# Patient Record
Sex: Male | Born: 1985
Health system: Southern US, Community
[De-identification: ages and names within clinical notes are randomized; demographics above are authoritative.]

## PROBLEM LIST (undated history)

## (undated) DIAGNOSIS — K219 Gastro-esophageal reflux disease without esophagitis: Secondary | ICD-10-CM

## (undated) DIAGNOSIS — K589 Irritable bowel syndrome without diarrhea: Secondary | ICD-10-CM

## (undated) DIAGNOSIS — T7840XA Allergy, unspecified, initial encounter: Secondary | ICD-10-CM

## (undated) DIAGNOSIS — F419 Anxiety disorder, unspecified: Secondary | ICD-10-CM

## (undated) HISTORY — PX: OTHER SURGICAL HISTORY: SHX169

## (undated) HISTORY — DX: Irritable bowel syndrome, unspecified: K58.9

## (undated) HISTORY — DX: Anxiety disorder, unspecified: F41.9

## (undated) HISTORY — DX: Allergy, unspecified, initial encounter: T78.40XA

## (undated) HISTORY — DX: Gastro-esophageal reflux disease without esophagitis: K21.9

---

## 2011-05-28 ENCOUNTER — Emergency Department: Payer: Self-pay | Admitting: Emergency Medicine

## 2012-04-05 ENCOUNTER — Emergency Department: Payer: Self-pay | Admitting: Emergency Medicine

## 2013-11-01 ENCOUNTER — Emergency Department: Payer: Self-pay | Admitting: Emergency Medicine

## 2013-11-01 LAB — URINALYSIS, COMPLETE
BILIRUBIN, UR: NEGATIVE
Bacteria: NONE SEEN
Glucose,UR: NEGATIVE mg/dL (ref 0–75)
KETONE: NEGATIVE
LEUKOCYTE ESTERASE: NEGATIVE
NITRITE: NEGATIVE
PROTEIN: NEGATIVE
Ph: 6 (ref 4.5–8.0)
RBC,UR: 1 /HPF (ref 0–5)
SPECIFIC GRAVITY: 1.015 (ref 1.003–1.030)
Squamous Epithelial: NONE SEEN

## 2013-11-01 LAB — COMPREHENSIVE METABOLIC PANEL
Albumin: 4.3 g/dL (ref 3.4–5.0)
Alkaline Phosphatase: 71 U/L
Anion Gap: 5 — ABNORMAL LOW (ref 7–16)
BILIRUBIN TOTAL: 0.6 mg/dL (ref 0.2–1.0)
BUN: 22 mg/dL — ABNORMAL HIGH (ref 7–18)
CHLORIDE: 100 mmol/L (ref 98–107)
CREATININE: 1.14 mg/dL (ref 0.60–1.30)
Calcium, Total: 9.6 mg/dL (ref 8.5–10.1)
Co2: 28 mmol/L (ref 21–32)
EGFR (African American): >60
Glucose: 102 mg/dL — ABNORMAL HIGH (ref 65–99)
Osmolality: 270 (ref 275–301)
Potassium: 3.8 mmol/L (ref 3.5–5.1)
SGOT(AST): 25 U/L (ref 15–37)
SGPT (ALT): 25 U/L (ref 12–78)
Sodium: 133 mmol/L — ABNORMAL LOW (ref 136–145)
TOTAL PROTEIN: 8.1 g/dL (ref 6.4–8.2)

## 2013-11-01 LAB — CBC WITH DIFFERENTIAL/PLATELET
Basophil #: 0.1 10*3/uL (ref 0.0–0.1)
Basophil %: 0.6 %
EOS ABS: 0 10*3/uL (ref 0.0–0.7)
Eosinophil %: 0.4 %
HCT: 50.6 % (ref 40.0–52.0)
HGB: 17.3 g/dL (ref 13.0–18.0)
Lymphocyte #: 1.3 10*3/uL (ref 1.0–3.6)
Lymphocyte %: 11.8 %
MCH: 33.2 pg (ref 26.0–34.0)
MCHC: 34.2 g/dL (ref 32.0–36.0)
MCV: 97 fL (ref 80–100)
Monocyte #: 0.7 x10 3/mm (ref 0.2–1.0)
Monocyte %: 6.2 %
NEUTROS ABS: 8.8 10*3/uL — AB (ref 1.4–6.5)
Neutrophil %: 81 %
PLATELETS: 262 10*3/uL (ref 150–440)
RBC: 5.21 10*6/uL (ref 4.40–5.90)
RDW: 13.4 % (ref 11.5–14.5)
WBC: 10.9 10*3/uL — ABNORMAL HIGH (ref 3.8–10.6)

## 2013-11-01 LAB — LIPASE, BLOOD: Lipase: 146 U/L (ref 73–393)

## 2014-10-13 ENCOUNTER — Emergency Department: Admit: 2014-10-13 | Disposition: A | Discharge status: Home / Self Care | Payer: Self-pay | Admitting: Internal Medicine

## 2014-10-13 LAB — COMPREHENSIVE METABOLIC PANEL
ALK PHOS: 57 U/L
ANION GAP: 5 — AB (ref 7–16)
Albumin: 4.7 g/dL
BILIRUBIN TOTAL: 0.9 mg/dL
BUN: 16 mg/dL
CREATININE: 1.08 mg/dL
Calcium, Total: 9.3 mg/dL
Chloride: 103 mmol/L
Co2: 29 mmol/L
EGFR (African American): >60
Glucose: 99 mg/dL
POTASSIUM: 3.9 mmol/L
SGOT(AST): 23 U/L
SGPT (ALT): 20 U/L
Sodium: 137 mmol/L
Total Protein: 7.7 g/dL

## 2014-10-13 LAB — URINALYSIS, COMPLETE
Bacteria: NONE SEEN
Bilirubin,UR: NEGATIVE
Glucose,UR: NEGATIVE mg/dL (ref 0–75)
KETONE: NEGATIVE
Leukocyte Esterase: NEGATIVE
Nitrite: NEGATIVE
PH: 7 (ref 4.5–8.0)
PROTEIN: NEGATIVE
SPECIFIC GRAVITY: 1.015 (ref 1.003–1.030)
Squamous Epithelial: NONE SEEN

## 2014-10-13 LAB — CBC
HCT: 46.3 % (ref 40.0–52.0)
HGB: 15.9 g/dL (ref 13.0–18.0)
MCH: 32.9 pg (ref 26.0–34.0)
MCHC: 34.4 g/dL (ref 32.0–36.0)
MCV: 96 fL (ref 80–100)
Platelet: 266 10*3/uL (ref 150–440)
RBC: 4.84 10*6/uL (ref 4.40–5.90)
RDW: 12.2 % (ref 11.5–14.5)
WBC: 10 10*3/uL (ref 3.8–10.6)

## 2014-10-19 ENCOUNTER — Encounter: Payer: Self-pay | Admitting: Primary Care

## 2014-10-19 ENCOUNTER — Ambulatory Visit (INDEPENDENT_AMBULATORY_CARE_PROVIDER_SITE_OTHER): Payer: BLUE CROSS/BLUE SHIELD | Admitting: Primary Care

## 2014-10-19 VITALS — BP 130/60 | HR 90 | Temp 98.0°F | Ht 68.0 in | Wt 163.4 lb

## 2014-10-19 DIAGNOSIS — R234 Changes in skin texture: Secondary | ICD-10-CM

## 2014-10-19 DIAGNOSIS — K219 Gastro-esophageal reflux disease without esophagitis: Secondary | ICD-10-CM | POA: Insufficient documentation

## 2014-10-19 NOTE — Patient Instructions (Signed)
Your xrays and blood work from [facility] is normal, including your liver counts. Continue taking the meloxicam and flexeril for the next 3 days. You will take the omeprazole once daily for reflux symptoms. Schedule a physical in the next 3-4 months. You will also schedule a lab appointment one week prior. You will be contacted regarding your referral to dermatology, please let me know if you don't hear back in one week. It was a pleasure to meet you today! Please don't hesitate to call me with any questions. Welcome to [facility]!

## 2014-10-19 NOTE — Progress Notes (Signed)
Pre visit review using our clinic review tool, if applicable. No additional management support is needed unless otherwise documented below in the visit note. 

## 2014-10-19 NOTE — Assessment & Plan Note (Signed)
Multiple moles present to posterior chest.  2 in particular appear abnormal. Referral made to dermatology for further evaluation.

## 2014-10-19 NOTE — Progress Notes (Signed)
Subjective:    Patient ID: Rodney Gardner, male    DOB: 1986/06/18, 29 y.o.   MRN: [id number]  HPI  Rodney Gardner is a 29 year old male who presents today to establish care and discuss the problems mentioned below. Rodney Gardner reports Rodney Gardner's not seen a PCP in over 10 years and doesn't believe Rodney Gardner has any records. Will review records from [facility] ED.  Rodney Gardner was seen in the [facility] Emergency Department on 10/13/14.  Rodney Gardner presented with RUQ abdominal/chest wall pain that has been present intermittently since November 2015, with worsening pain over past week. Rodney Gardner was also evaluated for low back pain. Rodney Gardner endured xrays and blood work. His xrays showed some L4/L5 disc narrowing and a mild lumbar and thoracic scoliosis. No fracture. His blood work and urinalysis was unremarkable.  Rodney Gardner was prescribed flexeril and mobic for low back pain which has improved. Rodney Gardner was also determined to have GERD and was provided with omeprazole 20mg  daily.  His RUQ pain is also much improved after 2 days of taking the medications (meloxicam and omeprazole, with occasional flexeril).  Denies recent injury/trauma.  Rodney Gardner was concerned that Rodney Gardner may have liver damage due to a history of heavy drinking and steroid.   2) GERD: Taking Omeprazole 20mg  daily and reports a decrease in symptoms. Rodney Gardner does report reflux of gastric contents if laying down after meals, after drinking carbonated beverages, and eating spicy foods. Denies nausea, vomiting, bloody stools.  3) Mole changes: Rodney Gardner's noticed a texture change in 2 moles that are present to his back. His friends told him that the moles have different colors and are shaped oddly. Denies pain, itching, bleeding.  Review of Systems  Constitutional: Negative for unexpected weight change.  HENT: Negative for rhinorrhea.   Respiratory: Negative for cough and shortness of breath.   Cardiovascular: Negative for chest pain.  Gastrointestinal: Negative for nausea, vomiting, abdominal pain, diarrhea, constipation and  blood in stool.  Genitourinary: Negative for dysuria and frequency.  Musculoskeletal:       Seen at [facility] for low back pain, now better.  Skin: Negative for rash.  Neurological: Negative for dizziness and headaches.  Psychiatric/Behavioral:       Denies concerns for anxiety or depression.       Past Medical History  Diagnosis Date  . Allergy     Seasonal allergies    History   Social History  . Marital Status: Single    Spouse Name: N/A  . Number of Children: N/A  . Years of Education: N/A   Occupational History  . Not on file.   Social History Main Topics  . Smoking status: Current Every Day Smoker -- 0.50 packs/day    Types: Cigarettes  . Smokeless tobacco: Not on file  . Alcohol Use: 0.0 oz/week    0 Standard drinks or equivalent per week     Comment: occ  . Drug Use: No  . Sexual Activity: Not on file   Other Topics Concern  . Not on file   Social History Narrative   Single.   Works on [employer] has been for 10 years.   68 son 84 years old.   Enjoys working out, being outside.   Has a place at the beach.    History reviewed. No pertinent past surgical history.  Family History  Problem Relation Age of Onset  . Cancer Mother     thyroid    Allergies  Allergen Reactions  . Wellbutrin [Bupropion] Hives  No current outpatient prescriptions on file prior to visit.   No current facility-administered medications on file prior to visit.    BP 130/60 mmHg  Pulse 90  Temp(Src) 98 F (36.7 C) (Oral)  Ht 5\' 8"  (1.727 m)  Wt 163 lb 6.4 oz (74.118 kg)  BMI 24.85 kg/m2  SpO2 98%    Objective:   Physical Exam  Constitutional: Rodney Gardner is oriented to person, place, and time. Rodney Gardner appears well-developed.  HENT:  Right Ear: External ear normal.  Left Ear: External ear normal.  Nose: Nose normal.  Mouth/Throat: Oropharynx is clear and moist.  Eyes: EOM are normal. Pupils are equal, round, and reactive to light.  Neck: Neck supple. No thyromegaly present.    Cardiovascular: Normal rate and regular rhythm.   Pulmonary/Chest: Effort normal and breath sounds normal.  Abdominal: Soft. Bowel sounds are normal. Rodney Gardner exhibits no mass. There is no tenderness.  Lymphadenopathy:    Rodney Gardner has no cervical adenopathy.  Neurological: Rodney Gardner is alert and oriented to person, place, and time. Rodney Gardner has normal reflexes.  Skin: Skin is warm and dry.     2 small misshapen and discolored moles present to posterior chest.   Psychiatric: Rodney Gardner has a normal mood and affect.          Assessment & Plan:

## 2014-10-19 NOTE — Assessment & Plan Note (Signed)
Reflux of contents if eating trigger foods and laying flat after meals. Taking omeprazole 20mg  daily with a decrease in symptoms. Will continue to monitor.

## 2014-11-15 ENCOUNTER — Other Ambulatory Visit: Payer: Self-pay | Admitting: Primary Care

## 2015-02-10 ENCOUNTER — Other Ambulatory Visit: Payer: Self-pay | Admitting: Primary Care

## 2015-02-10 DIAGNOSIS — Z0000 Encounter for general adult medical examination without abnormal findings: Secondary | ICD-10-CM

## 2015-02-20 ENCOUNTER — Other Ambulatory Visit: Payer: BLUE CROSS/BLUE SHIELD

## 2015-02-23 ENCOUNTER — Encounter: Payer: BLUE CROSS/BLUE SHIELD | Admitting: Primary Care

## 2015-05-27 ENCOUNTER — Other Ambulatory Visit: Payer: Self-pay | Admitting: Primary Care

## 2015-05-29 NOTE — Telephone Encounter (Signed)
Electronically refill request for   omeprazole (PRILOSEC) 20 MG capsule   TAKE ONE CAPSULE BY MOUTH EVERY DAY  Dispense: 30 capsule   Refills: 5     Last prescribed on 11/15/2014. Last seen on 10/19/2014. No future appointment.

## 2015-12-11 ENCOUNTER — Other Ambulatory Visit: Payer: Self-pay | Admitting: Primary Care

## 2016-04-05 ENCOUNTER — Ambulatory Visit (INDEPENDENT_AMBULATORY_CARE_PROVIDER_SITE_OTHER): Payer: BLUE CROSS/BLUE SHIELD | Admitting: Primary Care

## 2016-04-05 ENCOUNTER — Encounter: Payer: Self-pay | Admitting: Primary Care

## 2016-04-05 VITALS — BP 130/84 | HR 82 | Temp 97.9°F | Ht 68.0 in | Wt 171.0 lb

## 2016-04-05 DIAGNOSIS — K589 Irritable bowel syndrome without diarrhea: Secondary | ICD-10-CM | POA: Diagnosis not present

## 2016-04-05 DIAGNOSIS — K219 Gastro-esophageal reflux disease without esophagitis: Secondary | ICD-10-CM

## 2016-04-05 DIAGNOSIS — F411 Generalized anxiety disorder: Secondary | ICD-10-CM

## 2016-04-05 MED ORDER — DICYCLOMINE HCL 10 MG PO CAPS: 10.0000 mg | ORAL_CAPSULE | Freq: Three times a day (TID) | ORAL | 0 refills | Status: DC | PRN

## 2016-04-05 MED ORDER — ESCITALOPRAM OXALATE 10 MG PO TABS
10.0000 mg | ORAL_TABLET | Freq: Every day | ORAL | 1 refills | Status: DC
Start: 2016-04-05 — End: 2016-05-17

## 2016-04-05 NOTE — Patient Instructions (Signed)
Start Lexapro tablets for anxiety. Take 1/2 tablet by mouth everyday for 8 days, then advance to 1 full tablet thereafter.  You may take dicyclomine 10 mg tablets three times daily as needed for abdominal pain/spasms.  Continue Zantac 150 mg tablets for acid reflux. You may take this up to twice daily.  Nasal Polyp: Try using Flonase (fluticasone) nasal spray. Instill 1 spray in each nostril twice daily. Do this for an entire month.  Schedule a follow up visit in 6 weeks for re-evaluation.  It was a pleasure to see you today!  Food Choices for Gastroesophageal Reflux Disease, Adult When you have gastroesophageal reflux disease (GERD), the foods you eat and your eating habits are very important. Choosing the right foods can help ease the discomfort of GERD. WHAT GENERAL GUIDELINES DO I NEED TO FOLLOW?  Choose fruits, vegetables, whole grains, low-fat dairy products, and low-fat meat, fish, and poultry.  Limit fats such as oils, salad dressings, butter, nuts, and avocado.  Keep a food diary to identify foods that cause symptoms.  Avoid foods that cause reflux. These may be different for different people.  Eat frequent small meals instead of three large meals each day.  Eat your meals slowly, in a relaxed setting.  Limit fried foods.  Cook foods using methods other than frying.  Avoid drinking alcohol.  Avoid drinking large amounts of liquids with your meals.  Avoid bending over or lying down until 2-3 hours after eating. WHAT FOODS ARE NOT RECOMMENDED? The following are some foods and drinks that may worsen your symptoms: Vegetables Tomatoes. Tomato juice. Tomato and spaghetti sauce. Chili peppers. Onion and garlic. Horseradish. Fruits Oranges, grapefruit, and lemon (fruit and juice). Meats High-fat meats, fish, and poultry. This includes hot dogs, ribs, ham, sausage, salami, and bacon. Dairy Whole milk and chocolate milk. Sour cream. Cream. Butter. Ice cream. Cream  cheese.  Beverages Coffee and tea, with or without caffeine. Carbonated beverages or energy drinks. Condiments Hot sauce. Barbecue sauce.  Sweets/Desserts Chocolate and cocoa. Donuts. Peppermint and spearmint. Fats and Oils High-fat foods, including [place] fries and potato chips. Other Vinegar. Strong spices, such as black pepper, white pepper, red pepper, cayenne, curry powder, cloves, ginger, and chili powder. The items listed above may not be a complete list of foods and beverages to avoid. Contact your dietitian for more information.   This information is not intended to replace advice given to you by your health care provider. Make sure you discuss any questions you have with your health care provider.   Document Released: 06/17/2005 Document Revised: 07/08/2014 Document Reviewed: 04/21/2013 Elsevier Interactive Patient Education [employer].

## 2016-04-05 NOTE — Progress Notes (Signed)
Pre visit review using our clinic review tool, if applicable. No additional management support is needed unless otherwise documented below in the visit note. 

## 2016-04-05 NOTE — Progress Notes (Signed)
Subjective:    Patient ID: Rodney Gardner, male    DOB: [birth year], 30 y.o.   MRN: [id number]  HPI  Rodney Gardner is a 30 year old male with a history of GERD who presents today with a chief complaint of abdominal pain. His pain is located to the bilateral lower quadrants with radiation to his right lower back and RUQ. He also reports bloating/feeling gassy. His pain has been consistent for the past 4 years, some days his pain is worse. He describes his pain as "burning, sharp, tearing sensation". His abdominal pain is worse with increased stress, and his symptoms have increased in the past 3 months as he's undertaking more work with his job. He also reports multiple episodes of diarrhea daily for the past 3-4 years. Denies nausea, vomiting, changes in appetite, unexplained weight loss. He underwent a CT of his abdomen/pelivis in 2015 which was unremarkable. GAD 7 score of 16 today.  Over the years he's tried to self treat his pain by changing his diet, refraining from alcohol use, and stopped drinking alcohol without improvement, now he's resumed drinking (drinks on Friday and Saturday nights). He avoids fried foods, sodas, spicy foods.  He has a long history of esophageal reflux with symptoms including esophageal burning, difficulty swallowing, cough and choking. He takes Zantac 150 mg once daily and has done so for the past 1-2 months with improvement to his esophageal reflux, cough, and choking. He was previously managed on omeprazole 20 mg but did not notice improvement. He then increased his dose to 40 mg without improvement.   He smokes cigarettes and has done so for the past 7-8 years. He's been under a lot of stress for the past 3-4 years.   Review of Systems  Constitutional: Negative for chills, fatigue and fever.  Gastrointestinal: Positive for abdominal pain and diarrhea. Negative for blood in stool, constipation, nausea and vomiting.  Genitourinary: Negative for dysuria, flank pain,  frequency and hematuria.  Musculoskeletal: Positive for back pain.  Psychiatric/Behavioral: Negative for suicidal ideas. The patient is nervous/anxious.        Past Medical History:  Diagnosis Date  . Allergy    Seasonal allergies     Social History   Social History  . Marital status: Single    Spouse name: N/A  . Number of children: N/A  . Years of education: N/A   Occupational History  . Not on file.   Social History Main Topics  . Smoking status: Current Some Day Smoker    Packs/day: 0.50    Types: Cigarettes  . Smokeless tobacco: Never Used  . Alcohol use 0.0 oz/week     Comment: occ  . Drug use: No  . Sexual activity: Not on file   Other Topics Concern  . Not on file   Social History Narrative   Single.   Works on [employer] has been for 10 years.   47 son 57 years old.   Enjoys working out, being outside.   Has a place at the beach.    No past surgical history on file.  Family History  Problem Relation Age of Onset  . Cancer Mother     thyroid    Allergies  Allergen Reactions  . Wellbutrin [Bupropion] Hives    No current outpatient prescriptions on file prior to visit.   No current facility-administered medications on file prior to visit.     BP 130/84   Pulse 82   Temp 97.9 F (36.6 C) (Oral)  Ht 5\' 8"  (1.727 m)   Wt 171 lb (77.6 kg)   SpO2 98%   BMI 26.00 kg/m    Objective:   Physical Exam  Constitutional: He appears well-nourished.  Cardiovascular: Normal rate and regular rhythm.   Pulmonary/Chest: Effort normal and breath sounds normal.  Abdominal: Soft. Normal appearance and bowel sounds are normal. There is generalized tenderness. There is no rebound, no tenderness at McBurney's point and negative Murphy's sign.  Skin: Skin is warm and dry.  Psychiatric: He has a normal mood and affect.          Assessment & Plan:

## 2016-04-05 NOTE — Assessment & Plan Note (Signed)
Improvement on Zantac 150 mg. Discussed that he may take this twice daily. Will continue to monitor.

## 2016-04-05 NOTE — Assessment & Plan Note (Signed)
Symptoms today and over the pat 3-4 years sound like IBS given constant stress with presents of symptoms with diarrhea. Do not suspect acute appendicitis, gall bladder involvement, diverticulitis.  Normal CT in 2015. Will treat stress/anxiety with Lexapro, treat abdominal cramping/pain with Dicyclomine. Follow up in 6 weeks.

## 2016-04-05 NOTE — Assessment & Plan Note (Signed)
GAD 7 score of 16 today. Likely contributing to his abdominal symptoms. Long discussion today regarding treatment options.  Start Lexapro 10 mg. Patient is to take 1/2 tablet daily for 8 days, then advance to 1 full tablet thereafter. We discussed possible side effects of headache, GI upset, drowsiness, and SI/HI. If thoughts of SI/HI develop, we discussed to present to the emergency immediately. Patient verbalized understanding.   Follow up in 6 weeks.

## 2016-05-07 ENCOUNTER — Other Ambulatory Visit: Payer: Self-pay | Admitting: Primary Care

## 2016-05-07 ENCOUNTER — Encounter: Payer: Self-pay | Admitting: Primary Care

## 2016-05-07 DIAGNOSIS — K589 Irritable bowel syndrome without diarrhea: Secondary | ICD-10-CM

## 2016-05-07 MED ORDER — DICYCLOMINE HCL 10 MG PO CAPS
10.0000 mg | ORAL_CAPSULE | Freq: Three times a day (TID) | ORAL | 0 refills | Status: DC | PRN
Start: 2016-05-07 — End: 2016-08-12

## 2016-05-12 ENCOUNTER — Encounter: Payer: Self-pay | Admitting: Primary Care

## 2016-05-17 ENCOUNTER — Ambulatory Visit (INDEPENDENT_AMBULATORY_CARE_PROVIDER_SITE_OTHER): Payer: BLUE CROSS/BLUE SHIELD | Admitting: Primary Care

## 2016-05-17 ENCOUNTER — Encounter: Payer: Self-pay | Admitting: Primary Care

## 2016-05-17 VITALS — BP 114/70 | HR 60 | Temp 98.3°F | Ht 68.0 in | Wt 167.1 lb

## 2016-05-17 DIAGNOSIS — B9689 Other specified bacterial agents as the cause of diseases classified elsewhere: Secondary | ICD-10-CM

## 2016-05-17 DIAGNOSIS — F411 Generalized anxiety disorder: Secondary | ICD-10-CM

## 2016-05-17 DIAGNOSIS — J019 Acute sinusitis, unspecified: Secondary | ICD-10-CM

## 2016-05-17 DIAGNOSIS — K219 Gastro-esophageal reflux disease without esophagitis: Secondary | ICD-10-CM

## 2016-05-17 DIAGNOSIS — K589 Irritable bowel syndrome without diarrhea: Secondary | ICD-10-CM

## 2016-05-17 MED ORDER — AMOXICILLIN-POT CLAVULANATE 875-125 MG PO TABS: 1.0000 | ORAL_TABLET | Freq: Two times a day (BID) | ORAL | 0 refills | Status: DC

## 2016-05-17 MED ORDER — ESCITALOPRAM OXALATE 20 MG PO TABS: 20.0000 mg | ORAL_TABLET | Freq: Every day | ORAL | 1 refills | Status: DC

## 2016-05-17 MED ORDER — PANTOPRAZOLE SODIUM 20 MG PO TBEC: 20.0000 mg | DELAYED_RELEASE_TABLET | Freq: Every day | ORAL | 0 refills | Status: DC

## 2016-05-17 NOTE — Progress Notes (Signed)
Subjective:    Patient ID: Rodney Gardner, male    DOB: [birth year], 30 y.o.   MRN: [id number]  HPI  Mr. Debardelaben is a 30 year old male who presents today for follow up of generalized anxiety disorder. He presented in early October 2017 with complaints of IBS symptoms that have been present over the past 3-4 years. He also struggles with anxiety which was suspected to be contributing to abdominal symptoms. He was placed on Lexapro 10 mg for control of anxiety.  Since his last visit he's not noticed any improvement in anxiety or abdominal symptoms. He continues to notice esophageal reflux nearly daily and has been taking Zantac 150 mg BID. He was previously managed on omeprazole without improvement. He actually notices more improvement on Zantac than he did with omeprazole. He continues to experience right lower quadrant abdominal pain which he describes as "uncomfortable". He did take the dicyclomine once daily with improvement, but has not yet picked up his refill.  He continues to feel stressed as he is overwhelmed with work. He hasn't noticed any improvement since initiation of Lexapro 10 mg.    Review of Systems  Constitutional: Negative for fever.  Respiratory: Negative for shortness of breath.   Cardiovascular: Negative for chest pain.  Gastrointestinal: Positive for abdominal pain.       Esophageal reflux symptoms  Psychiatric/Behavioral: Negative for suicidal ideas. The patient is nervous/anxious.        Past Medical History:  Diagnosis Date  . Allergy    Seasonal allergies     Social History   Social History  . Marital status: Single    Spouse name: N/A  . Number of children: N/A  . Years of education: N/A   Occupational History  . Not on file.   Social History Main Topics  . Smoking status: Current Some Day Smoker    Packs/day: 0.50    Types: Cigarettes  . Smokeless tobacco: Never Used  . Alcohol use 0.0 oz/week     Comment: occ  . Drug use: No  . Sexual activity:  Not on file   Other Topics Concern  . Not on file   Social History Narrative   Single.   Works on [employer] has been for 10 years.   107 son 31 years old.   Enjoys working out, being outside.   Has a place at the beach.    No past surgical history on file.  Family History  Problem Relation Age of Onset  . Cancer Mother     thyroid    Allergies  Allergen Reactions  . Wellbutrin [Bupropion] Hives    Current Outpatient Prescriptions on File Prior to Visit  Medication Sig Dispense Refill  . dicyclomine (BENTYL) 10 MG capsule Take 1 capsule (10 mg total) by mouth 3 (three) times daily as needed for spasms. 30 capsule 0  . ranitidine (ZANTAC) 150 MG tablet Take 150 mg by mouth daily.     No current facility-administered medications on file prior to visit.     BP 114/70   Pulse 60   Temp 98.3 F (36.8 C) (Oral)   Ht 5\' 8"  (1.727 m)   Wt 167 lb 1.9 oz (75.8 kg)   SpO2 98%   BMI 25.41 kg/m    Objective:   Physical Exam  Constitutional: He appears well-nourished.  Neck: Neck supple.  Cardiovascular: Normal rate and regular rhythm.   Pulmonary/Chest: Effort normal and breath sounds normal.  Abdominal: Soft. Bowel sounds are normal.  There is no tenderness.  Skin: Skin is warm and dry.  Psychiatric: He has a normal mood and affect.          Assessment & Plan:

## 2016-05-17 NOTE — Patient Instructions (Signed)
We've increased your dose of Lexapro from 10 mg to 20 mg. You may take two of the 10 mg tablets until your current bottle is empty. Take 1 of the 20 mg tablets once daily.  Start pantoprazole (Protonix) 20 mg tablets for acid reflux. Take 1 tablet by mouth once daily. Stop using the Zantac for now.  Start Augmentin antibiotics. Take 1 tablet by mouth twice daily for 10 days.   Continue using Flonase (fluticasone) nasal spray. Instill 1 spray in each nostril twice daily.   Start taking a daily antihistamine such as Claritin, Zyrtec, Allegra.  Follow up in 6 weeks for re-evaluation.  It was a pleasure to see you today!

## 2016-05-17 NOTE — Progress Notes (Signed)
Pre visit review using our clinic review tool, if applicable. No additional management support is needed unless otherwise documented below in the visit note. 

## 2016-05-18 NOTE — Assessment & Plan Note (Signed)
Improvement with dicyclomine.  Continues to experience symptoms which are most likely secondary to uncontrolled anxiety and stress. Long discussion today regarding importance of symptom control. Will continue to treat anxiety. No alarm signs for IBS or abdominal pain.

## 2016-05-18 NOTE — Assessment & Plan Note (Signed)
Continues to experience daily symptoms with Zantac BID, but overall improvement than with omeprazole in the past. Will trial short term pantoprazole 20 mg.  If no improvement will increase dose. Discussed triggers of GERD. Continue to suspect anxiety as a major trigger.

## 2016-05-18 NOTE — Assessment & Plan Note (Signed)
No improvement on Lexapro 10 mg. Continued work stress, discussed the importance of stress reduction. Increase Lexapro to 20 mg. Follow up in 6 weeks. If no improvement consider adding Buspar.

## 2016-05-29 IMAGING — CR DG CHEST 2V
1 series · 2 of 2 positions shown · non-contrast
Comparison: None.

CLINICAL DATA: Chronic right-sided chest pain

EXAM:
CHEST  2 VIEW

[Series 1: w chest pa · 0.14mm/px · 2 of 2 slices shown]
[im 1/2]
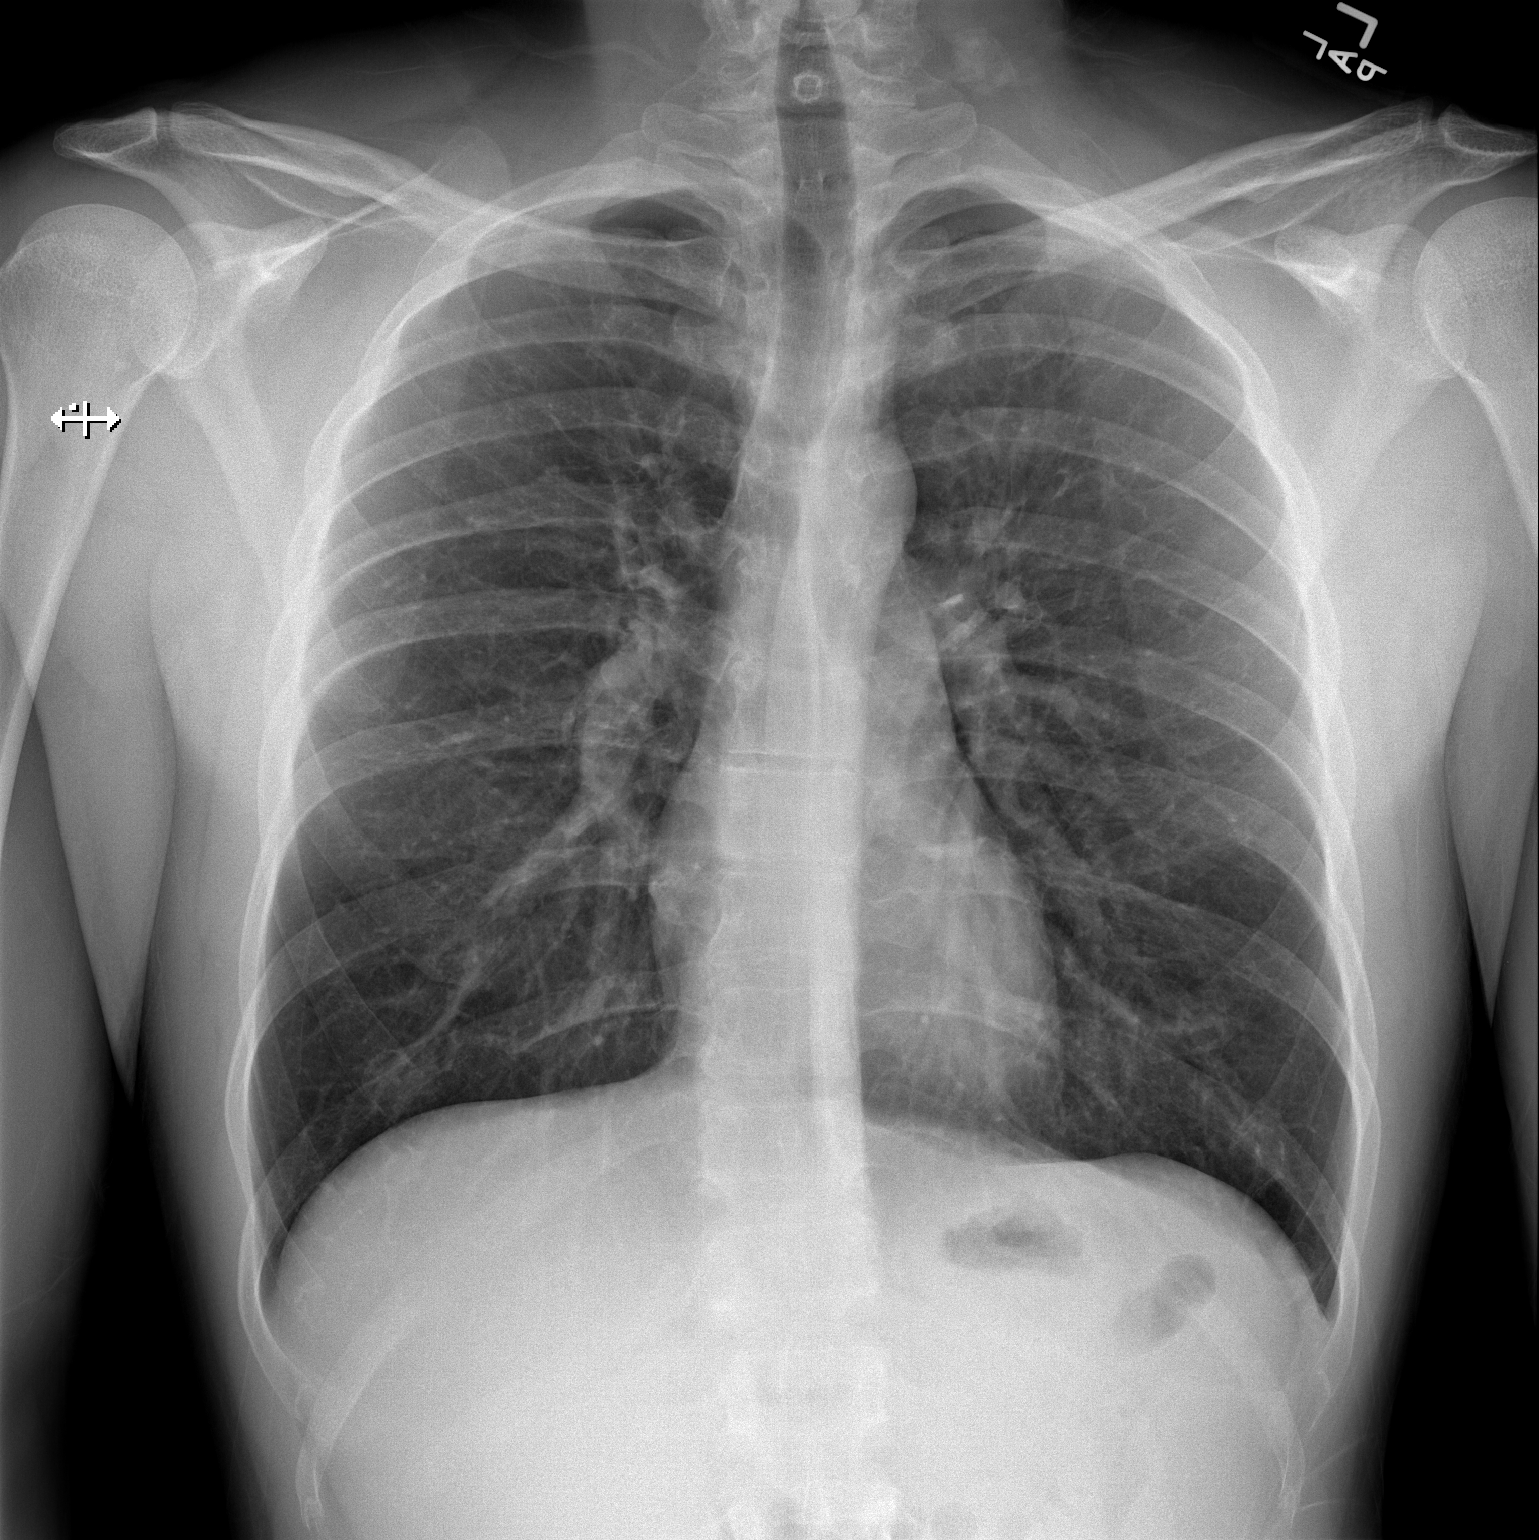
[im 2/2]
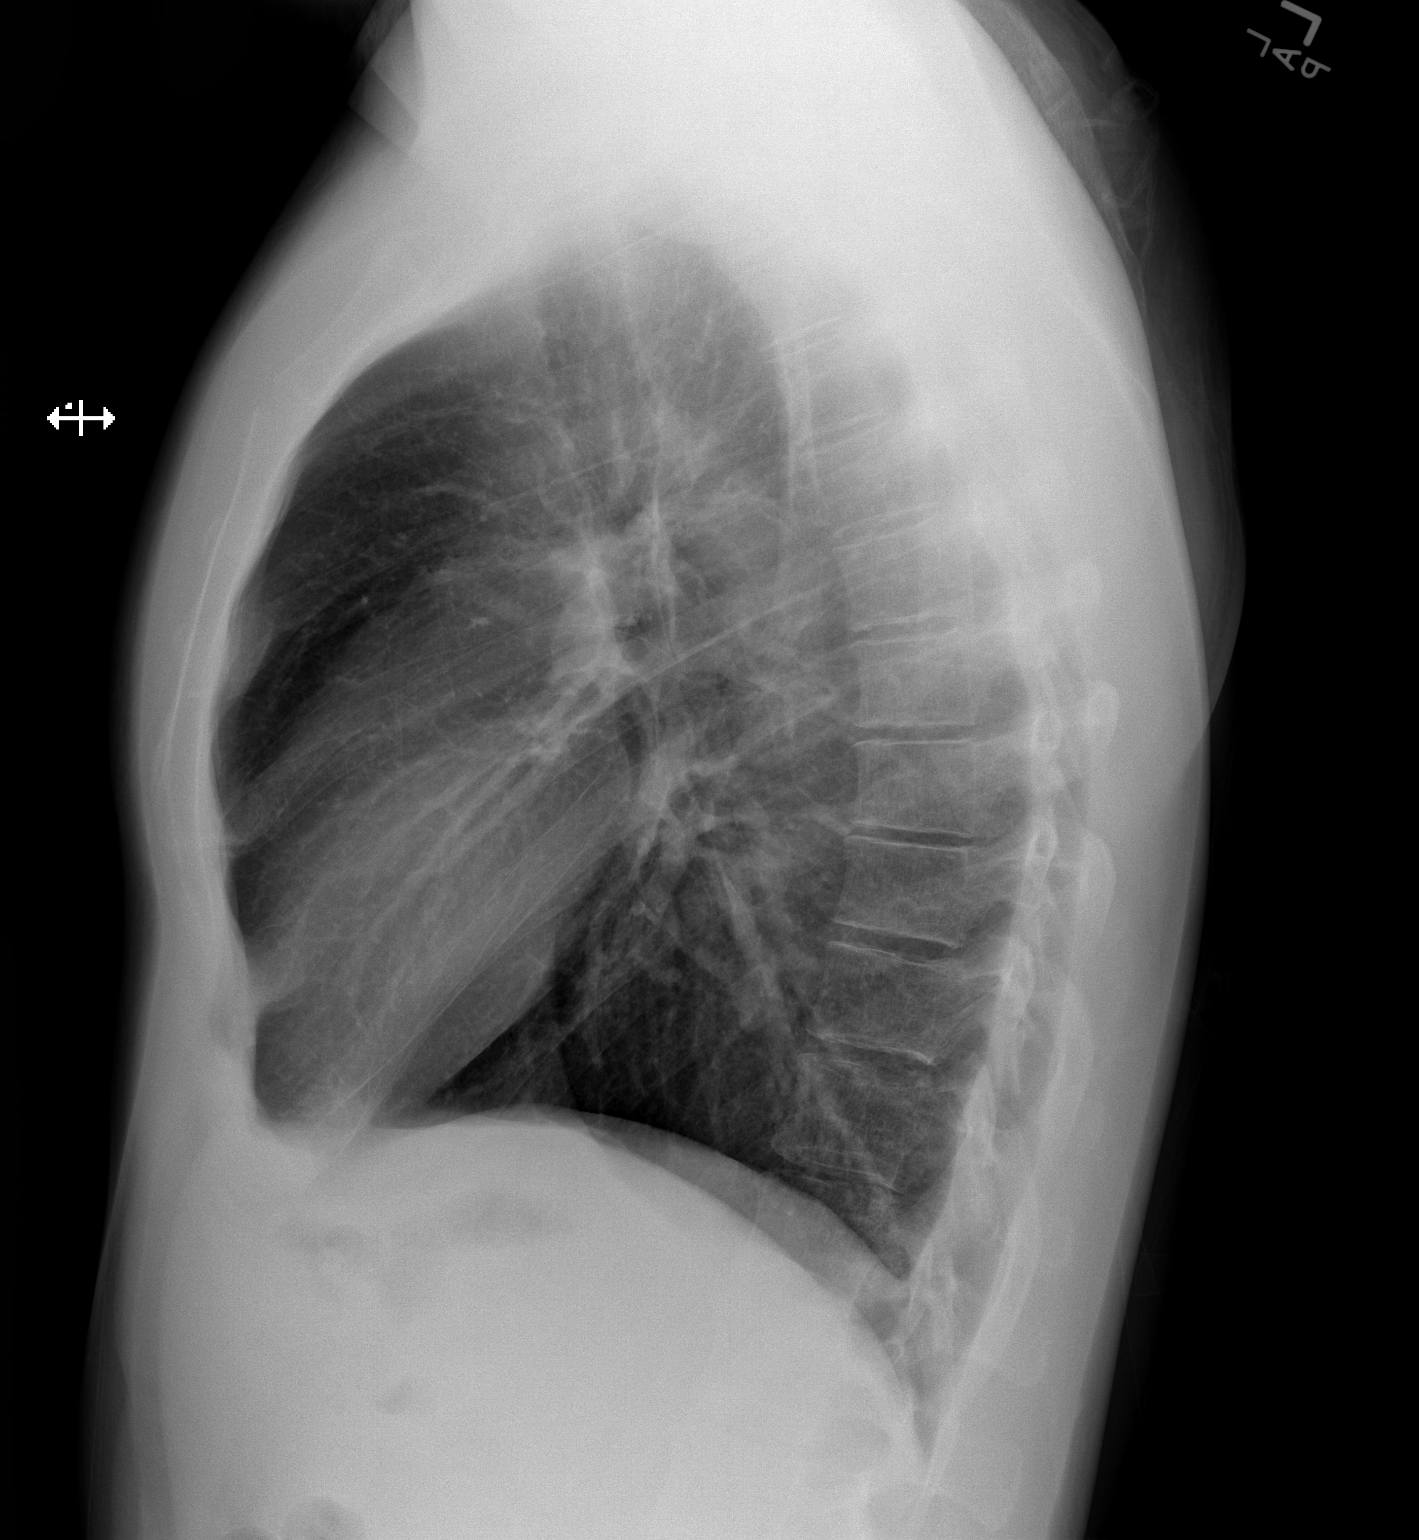

[2 of 2 positions shown; findings below may reference images not displayed]

FINDINGS: Lungs are clear. Heart size and pulmonary vascularity are normal. No
adenopathy. No pneumothorax. There is mild upper thoracic
levoscoliosis.
IMPRESSION: No edema or consolidation.

## 2016-05-29 IMAGING — CR DG LUMBAR SPINE 2-3V
1 series · 3 of 3 positions shown · non-contrast
Comparison: CT, 11/01/2013

CLINICAL DATA: Intermittent low back pain for 1 week. No specific
injury.

EXAM:
LUMBAR SPINE - 2-3 VIEW

[Series 1: w lumbar spine lat · 0.14mm/px · 3 of 3 slices shown]
[im 1/3]
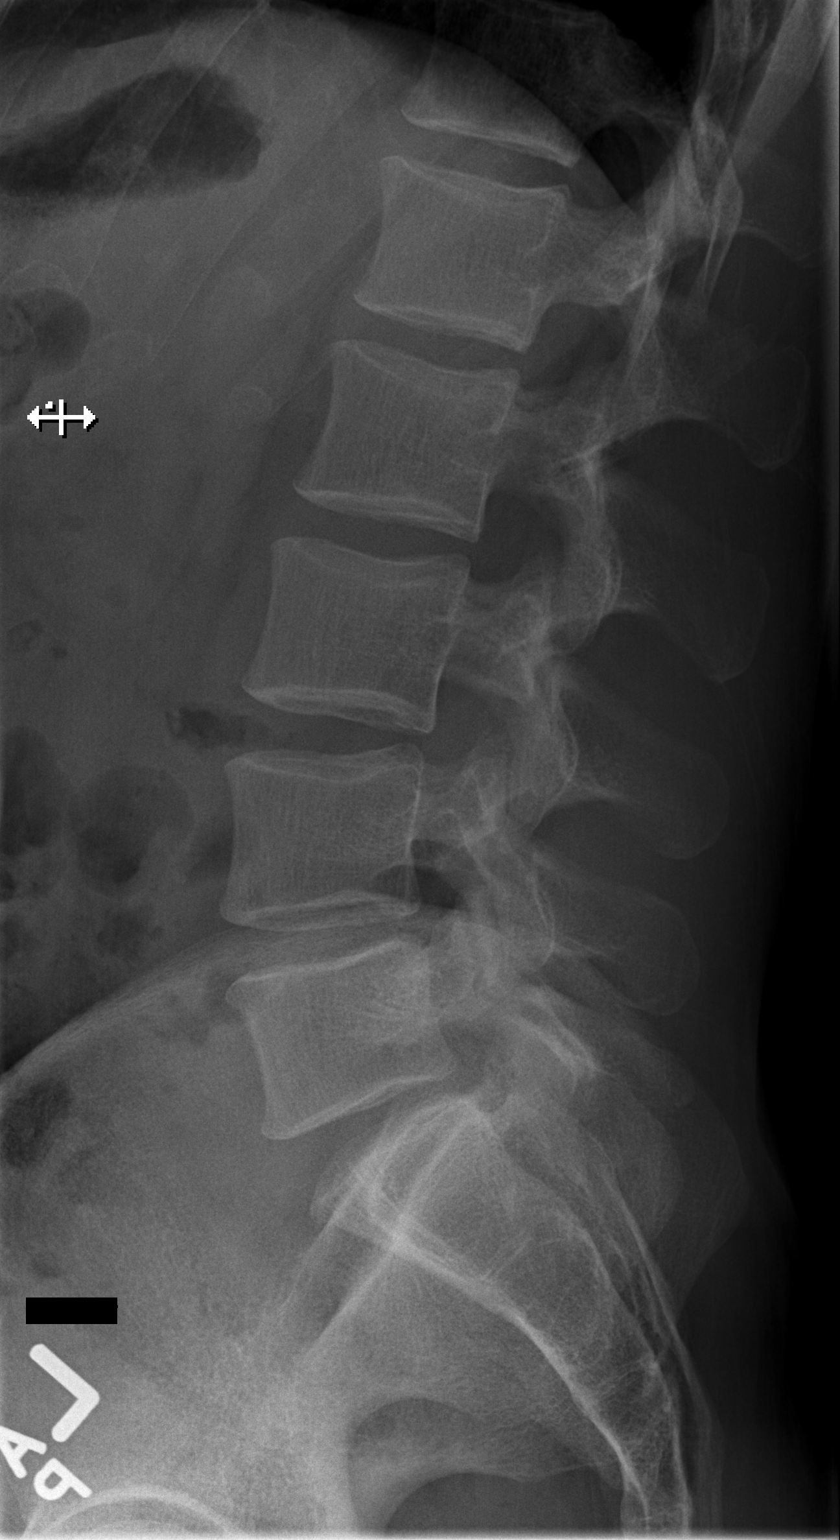
[im 2/3]
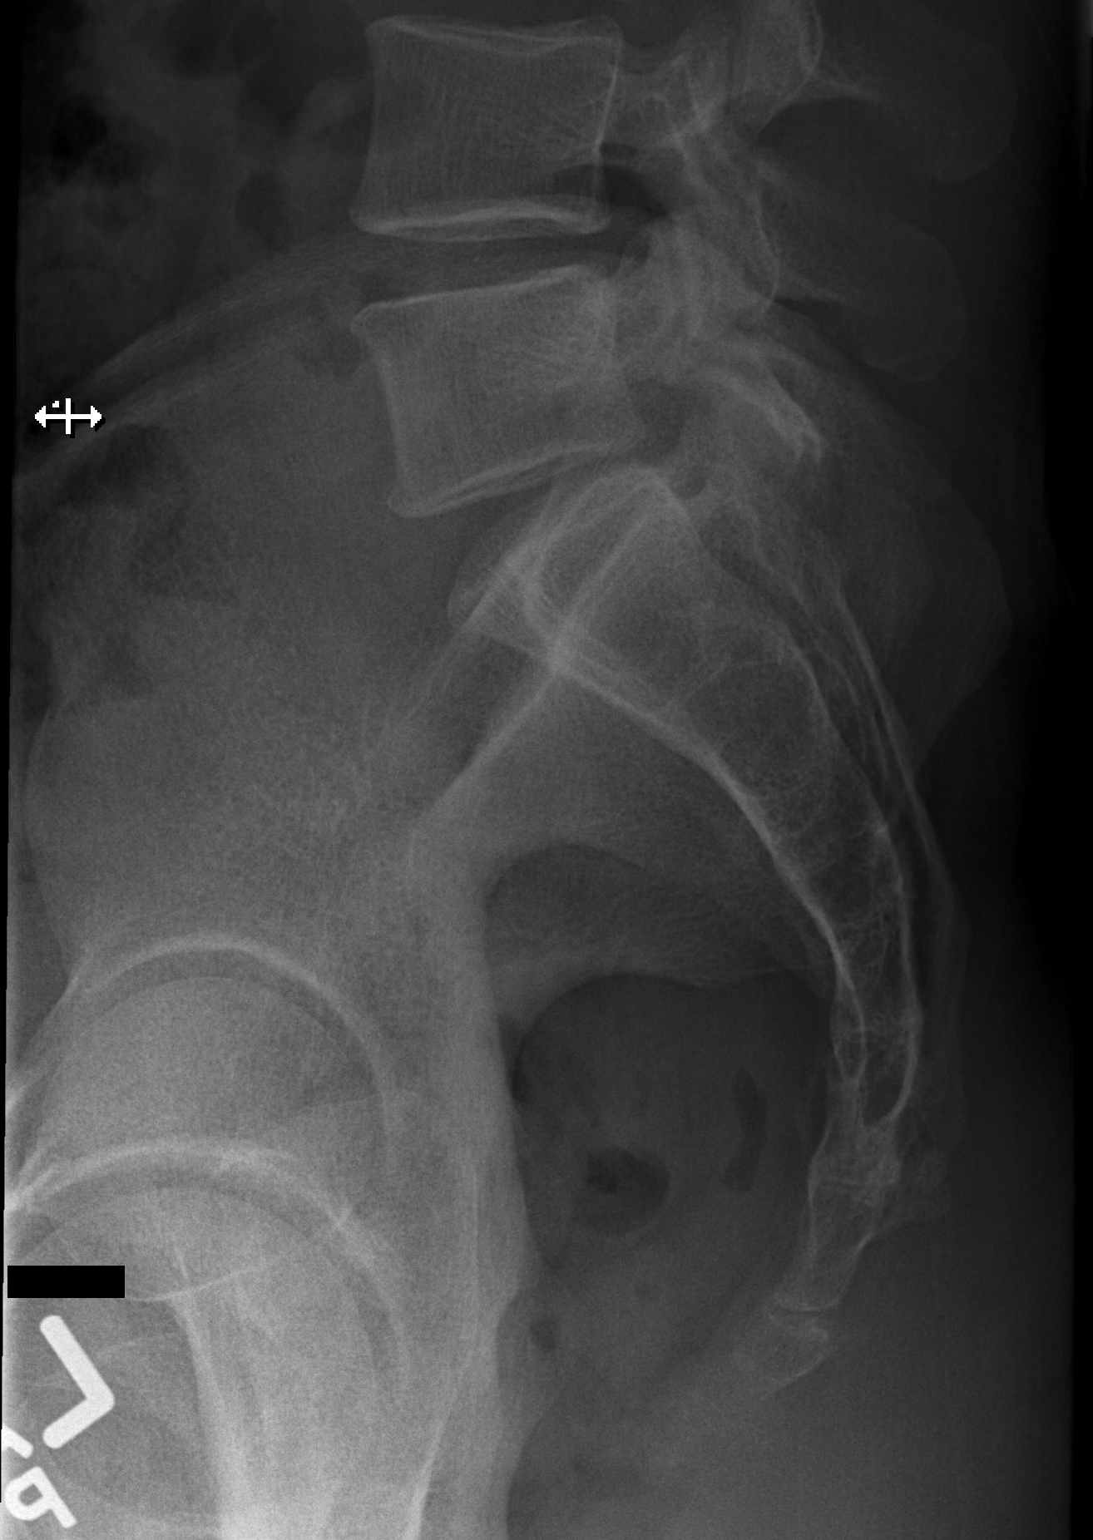
[im 3/3]
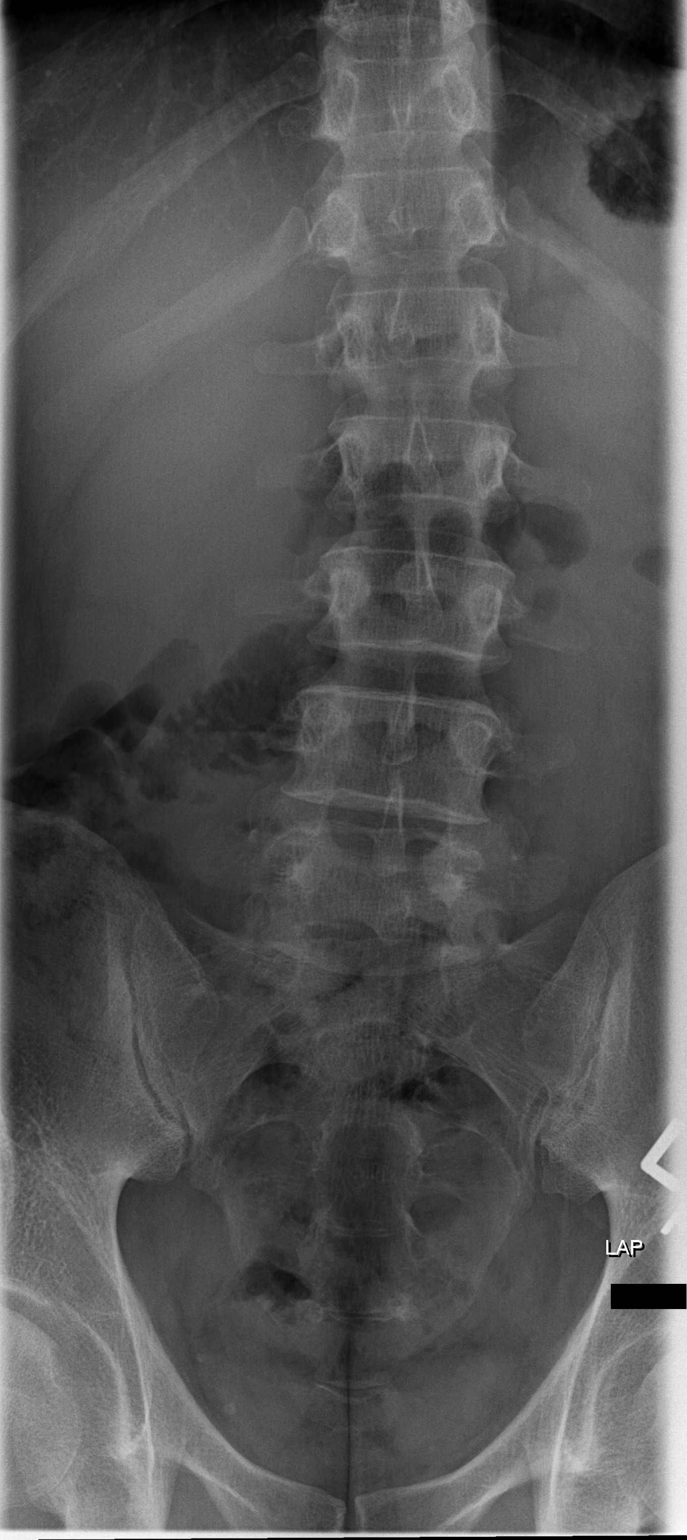

[3 of 3 positions shown; findings below may reference images not displayed]

FINDINGS: No fracture. No spondylolisthesis. There is mild loss of disc height
at L4-L5. Remaining lumbar disc spaces are well preserved.

Mild levoscoliosis with the apex at L2-L3.

Soft tissues are unremarkable.
IMPRESSION: 1. No fracture or acute finding.
2. Mild loss of disc height at L4-L5 and mild levoscoliosis.

## 2016-06-28 ENCOUNTER — Encounter: Payer: Self-pay | Admitting: Primary Care

## 2016-07-05 ENCOUNTER — Encounter: Payer: Self-pay | Admitting: Gastroenterology

## 2016-07-05 ENCOUNTER — Other Ambulatory Visit: Payer: Self-pay | Admitting: Primary Care

## 2016-07-05 ENCOUNTER — Ambulatory Visit: Payer: BLUE CROSS/BLUE SHIELD | Admitting: Primary Care

## 2016-07-05 DIAGNOSIS — R1084 Generalized abdominal pain: Secondary | ICD-10-CM

## 2016-07-31 ENCOUNTER — Other Ambulatory Visit: Payer: Self-pay | Admitting: Primary Care

## 2016-07-31 DIAGNOSIS — F411 Generalized anxiety disorder: Secondary | ICD-10-CM

## 2016-07-31 NOTE — Telephone Encounter (Signed)
Received faxed refill request for escitalopram (LEXAPRO) 20 MG tablet and is requesting for 90 days supply  Last prescribed and seen on 05/17/2016

## 2016-07-31 NOTE — Telephone Encounter (Signed)
The last message I received from him was that the Lexapro wasn't helping. Is this the case?

## 2016-08-01 ENCOUNTER — Other Ambulatory Visit: Payer: Self-pay | Admitting: Primary Care

## 2016-08-01 DIAGNOSIS — F411 Generalized anxiety disorder: Secondary | ICD-10-CM

## 2016-08-02 NOTE — Telephone Encounter (Addendum)
Spoken to patient. He stated that it is true that Lexapro was not helping but at least it is something. Patient stated if possible for Anda Kraft to prescribed something else then. He is almost out of the medication Lexapro.

## 2016-08-04 NOTE — Telephone Encounter (Signed)
Please read the message you sent. Is the Lexapro helping or not helping with anxiety? I'll send more if it's helping.

## 2016-08-05 ENCOUNTER — Encounter: Payer: Self-pay | Admitting: Primary Care

## 2016-08-05 NOTE — Telephone Encounter (Signed)
Sorry about that. Correction made comment below.

## 2016-08-06 ENCOUNTER — Other Ambulatory Visit: Payer: Self-pay | Admitting: Primary Care

## 2016-08-06 DIAGNOSIS — K219 Gastro-esophageal reflux disease without esophagitis: Secondary | ICD-10-CM

## 2016-08-06 DIAGNOSIS — F411 Generalized anxiety disorder: Secondary | ICD-10-CM

## 2016-08-06 MED ORDER — ESCITALOPRAM OXALATE 20 MG PO TABS: 20.0000 mg | ORAL_TABLET | Freq: Every day | ORAL | 1 refills | Status: DC

## 2016-08-06 MED ORDER — PANTOPRAZOLE SODIUM 20 MG PO TBEC: 20.0000 mg | DELAYED_RELEASE_TABLET | Freq: Every day | ORAL | 0 refills | Status: DC

## 2016-08-12 ENCOUNTER — Telehealth: Payer: Self-pay | Admitting: Primary Care

## 2016-08-12 ENCOUNTER — Ambulatory Visit (INDEPENDENT_AMBULATORY_CARE_PROVIDER_SITE_OTHER): Payer: BLUE CROSS/BLUE SHIELD | Admitting: Gastroenterology

## 2016-08-12 ENCOUNTER — Encounter: Payer: Self-pay | Admitting: Gastroenterology

## 2016-08-12 VITALS — BP 102/76 | HR 68 | Ht 68.0 in | Wt 179.1 lb

## 2016-08-12 DIAGNOSIS — F411 Generalized anxiety disorder: Secondary | ICD-10-CM

## 2016-08-12 DIAGNOSIS — R1084 Generalized abdominal pain: Secondary | ICD-10-CM | POA: Diagnosis not present

## 2016-08-12 DIAGNOSIS — R12 Heartburn: Secondary | ICD-10-CM | POA: Diagnosis not present

## 2016-08-12 DIAGNOSIS — R197 Diarrhea, unspecified: Secondary | ICD-10-CM

## 2016-08-12 MED ORDER — NA SULFATE-K SULFATE-MG SULF 17.5-3.13-1.6 GM/177ML PO SOLN: 1.0000 | Freq: Once | ORAL | 0 refills | Status: AC

## 2016-08-12 NOTE — Patient Instructions (Signed)
If you are age 32 or older, your body mass index should be between 23-30. Your Body mass index is 27.24 kg/m. If this is out of the aforementioned range listed, please consider follow up with your Primary Care Provider.  If you are age 89 or younger, your body mass index should be between 19-25. Your Body mass index is 27.24 kg/m. If this is out of the aformentioned range listed, please consider follow up with your Primary Care Provider.   You have been scheduled for an endoscopy and colonoscopy. Please follow the written instructions given to you at your visit today. Please pick up your prep supplies at the pharmacy within the next 1-3 days. If you use inhalers (even only as needed), please bring them with you on the day of your procedure. Your physician has requested that you go to www.startemmi.com and enter the access code given to you at your visit today. This web site gives a general overview about your procedure. However, you should still follow specific instructions given to you by our office regarding your preparation for the procedure.  Thank you for choosing [facility]  Dr Wilfrid Lund III

## 2016-08-12 NOTE — Progress Notes (Addendum)
[facility] Consult Note:  History: Rodney Gardner 08/12/2016  Referring physician: Sheral Flow, NP  Reason for consult/chief complaint: Abdominal Pain (generalized abd pain, RUQ, LLQ and RLQ); Dysphagia (problems choking on food); and Diarrhea (intermittent)   Subjective  HPI:  This is a 31 year old man referred by primary care for generalized abdominal pain and other symptoms. He seems to had at least 5 years of this constellation of symptoms. He has generalized abdominal pain that will sometimes be focused in one place and then migrate elsewhere. It is there constantly and not clearly changed with position or time of day eating or bowel movements. It seems to be worse if there is pressure on it such as a seatbelt. He reports having had perhaps 3 ED visits and [place], these seem to have been in 20 06/19/2012 and 2015, but the actual reports cannot be viewed. A CT scan abdomen and pelvis from 2015 was normal. He also has an underlying anxiety disorder for which he was started on 20 g of Lexapro in October. He does not yet feel that it has been very helpful. He was also given dicyclomine and that has not helped any GI symptoms.  In addition, he has frequent pyrosis not improved on once daily PPI, sometimes feels that food gets stuck in his chest. He has intermittent nonbloody diarrhea as well.  ROS:  Review of Systems  Constitutional: Negative for appetite change and unexpected weight change.  HENT: Negative for mouth sores and voice change.   Eyes: Negative for pain and redness.  Respiratory: Negative for cough and shortness of breath.   Cardiovascular: Negative for chest pain and palpitations.  Genitourinary: Negative for dysuria and hematuria.  Musculoskeletal: Negative for arthralgias and myalgias.  Skin: Negative for pallor and rash.  Neurological: Negative for weakness and headaches.  Hematological: Negative for adenopathy.  Psychiatric/Behavioral:  The patient is nervous/anxious.      Past Medical History: Past Medical History:  Diagnosis Date  . Allergy    Seasonal allergies  . Anxiety   . GERD (gastroesophageal reflux disease)   . Irritable bowel syndrome (IBS)      Past Surgical History: History reviewed. No pertinent surgical history.   Family History: Family History  Problem Relation Age of Onset  . Thyroid cancer Mother   . Anuerysm Paternal Grandfather     brain    Social History: Social History   Social History  . Marital status: Single    Spouse name: N/A  . Number of children: 1  . Years of education: N/A   Social History Main Topics  . Smoking status: Current Some Day Smoker    Packs/day: 0.50    Types: Cigarettes  . Smokeless tobacco: Never Used  . Alcohol use 0.0 oz/week     Comment: occ  . Drug use: No  . Sexual activity: Not Asked   Other Topics Concern  . None   Social History Narrative   Single.   Works on [employer] has been for 10 years.   33 son 77 years old.   Enjoys working out, being outside.   [place] a place at the beach.   His partner is with him today. She reports that he often does not seem to have a good appetite, but there is been no weight loss.  Ottie says he typically does not drink alcohol much during the work week, but he might have a fifth of liquor on the weekends.  Allergies: Allergies  Allergen Reactions  . Wellbutrin [  Bupropion] Hives    Outpatient Meds: Current Outpatient Prescriptions  Medication Sig Dispense Refill  . escitalopram (LEXAPRO) 20 MG tablet Take 1 tablet (20 mg total) by mouth daily. 90 tablet 1  . pantoprazole (PROTONIX) 20 MG tablet Take 1 tablet (20 mg total) by mouth daily. 90 tablet 0  . Na Sulfate-K Sulfate-Mg Sulf 17.5-3.13-1.6 GM/180ML SOLN Take 1 kit by mouth once. 354 mL 0   No current facility-administered medications for this visit.       ___________________________________________________________________ Objective    Exam:  BP 102/76 (BP Location: Left Arm, Patient Position: Sitting, Cuff Size: Normal)   Pulse 68   Ht _0  (1.727 m) Comment: height measured without shoes  Wt 179 lb 2 oz (81.3 kg)   BMI 27.24 kg/m     General: this is a(n) pleasant, somewhat anxious-appearing man without muscle wasting  Eyes: sclera anicteric, no redness  ENT: oral mucosa moist without lesions, no cervical or supraclavicular lymphadenopathy, good dentition  CV: RRR without murmur, S1/S2, no JVD, no peripheral edema  Resp: clear to auscultation bilaterally, normal RR and effort noted  GI: soft. Mild scattered tenderness, with active bowel sounds. No guarding or palpable organomegaly noted.  Skin; warm and dry, no rash or jaundice noted  Neuro: awake, alert and oriented x 3. Normal gross motor function and fluent speech  Labs: CBC Latest Ref Rng & Units 10/13/2014 11/01/2013  WBC 3.8 - 10.6 x10 3/mm 3 10.0 10.9(H)  Hemoglobin 13.0 - 18.0 g/dL 15.9 17.3  Hematocrit 40.0 - 52.0 % 46.3 50.6  Platelets 150 - 440 x10 3/mm 3 266 262   CMP Latest Ref Rng & Units 10/13/2014 11/01/2013  Glucose mg/dL 99 102(H)  BUN mg/dL 16 22(H)  Creatinine mg/dL 1.08 1.14  Sodium mmol/L 137 133(L)  Potassium mmol/L 3.9 3.8  Chloride mmol/L 103 100  CO2 mmol/L 29 28  Calcium mg/dL 9.3 9.6  Total Protein g/dL 7.7 8.1  Total Bilirubin mg/dL 0.9 0.6  Alkaline Phos U/L 57 71  AST U/L 23 25  ALT U/L 20 25    Radiologic Studies:  2015 CT abdomen and pelvis reported as noted above  Assessment: Encounter Diagnoses  Name Primary?  . Generalized abdominal pain Yes  . Diarrhea, unspecified type   . Heartburn   . GAD (generalized anxiety disorder)   This all seems to most likely at up to a functional bowel disorder. The nature of the pain, the fact that is constant, the other associated symptoms all seem to point toward this. Some underlying inflammatory process seems less likely, neoplastic given less likely given the  duration of symptoms. I feel he needs an endoscopic workup for more definitive diagnosis. Even if it is normal, which I told him it may very well be,  I believe there is value in that so he can move on with other treatments for his underlying anxiety disorder. He is in agreement Plan:  EGD/colonoscopy  The benefits and risks of the planned procedure were described in detail with the patient or (when appropriate) their health care proxy.  Risks were outlined as including, but not limited to, bleeding, infection, perforation, adverse medication reaction leading to cardiac or pulmonary decompensation, or pancreatitis (if ERCP).  The limitation of incomplete mucosal visualization was also discussed.  No guarantees or warranties were given.  Decrease alcohol use  Thank you for the courtesy of this consult.  Please call me with any questions or concerns.  Nelida Meuse III  CC:  Sheral Flow, NP -

## 2016-08-12 NOTE — Telephone Encounter (Signed)
Please notify patient that the GI doctor contacted me and updated me on his visit. I would like to see Rodney Gardner in the office to discuss his anxiety medication as he never followed up as originally scheduled. I want to provide him with the best treatment possible, so we need to re-evaluate his Lexapro face-to-face.

## 2016-08-12 NOTE — Telephone Encounter (Signed)
-----   Message from [place], MD sent at 08/12/2016  4:54 PM EST ----- Hi  Saw this man today, suspect he has chronic functional abdominal pain related to anxiety disorder.  Going to do EGD and colonoscopy to rule out organic cause.  Just so you know, he reports that the Lexapro does not seem to be helping him very much, so maybe you want to think about an adjustment/change.  Thanks.  Wilfrid [facility]

## 2016-08-13 NOTE — Telephone Encounter (Signed)
Noted  

## 2016-08-13 NOTE — Telephone Encounter (Signed)
Spoken and notified patient of Kate's comments. Patient stated that he is not sure he can afford to come in due to his insurance deductible. Patient will send Anda Kraft a message through [facility].

## 2016-08-19 ENCOUNTER — Encounter: Payer: Self-pay | Admitting: Primary Care

## 2016-08-20 ENCOUNTER — Other Ambulatory Visit: Payer: Self-pay | Admitting: Primary Care

## 2016-08-20 DIAGNOSIS — F411 Generalized anxiety disorder: Secondary | ICD-10-CM

## 2016-08-20 MED ORDER — SERTRALINE HCL 50 MG PO TABS: 50.0000 mg | ORAL_TABLET | Freq: Every day | ORAL | 1 refills | Status: DC

## 2016-08-20 NOTE — Telephone Encounter (Signed)
Spoke with patient on phone today regarding concerns. He has been taking Amoxil (old prescription) over the last several days for URI symptoms and has noticed much improvement in his abdominal symptoms. Discussed he should call the GI office and notify. Also noted that his Lexapro is not working to reduce anxiety, GI agreed. Will switch to Zoloft 50 mg and follow up with him in 4 weeks. He verbalized understanding of the plan. He will stop Lexapro.

## 2016-08-28 ENCOUNTER — Encounter: Payer: Self-pay | Admitting: Gastroenterology

## 2016-09-10 ENCOUNTER — Ambulatory Visit (AMBULATORY_SURGERY_CENTER): Payer: 59 | Admitting: Gastroenterology

## 2016-09-10 ENCOUNTER — Encounter: Payer: Self-pay | Admitting: Gastroenterology

## 2016-09-10 VITALS — BP 112/67 | HR 82 | Temp 98.7°F | Resp 21 | Ht 68.0 in | Wt 179.0 lb

## 2016-09-10 DIAGNOSIS — R197 Diarrhea, unspecified: Secondary | ICD-10-CM

## 2016-09-10 DIAGNOSIS — R1084 Generalized abdominal pain: Secondary | ICD-10-CM | POA: Diagnosis present

## 2016-09-10 DIAGNOSIS — D125 Benign neoplasm of sigmoid colon: Secondary | ICD-10-CM | POA: Diagnosis not present

## 2016-09-10 DIAGNOSIS — D127 Benign neoplasm of rectosigmoid junction: Secondary | ICD-10-CM

## 2016-09-10 MED ORDER — SODIUM CHLORIDE 0.9 % IV SOLN: 500.0000 mL | INTRAVENOUS | Status: AC

## 2016-09-10 NOTE — Progress Notes (Signed)
Patient awakening,vss,report to rn 

## 2016-09-10 NOTE — Progress Notes (Signed)
Called to room to assist during endoscopic procedure.  Patient ID and intended procedure confirmed with present staff. Received instructions for my participation in the procedure from the performing physician.  

## 2016-09-10 NOTE — Progress Notes (Signed)
Pt's states no medical or surgical changes since previsit or office visit. 

## 2016-09-10 NOTE — Patient Instructions (Signed)
YOU HAD AN ENDOSCOPIC PROCEDURE TODAY AT THE [facility]:   Refer to the procedure report that was given to you for any specific questions about what was found during the examination.  If the procedure report does not answer your questions, please call your gastroenterologist to clarify.  If you requested that your care partner not be given the details of your procedure findings, then the procedure report has been included in a sealed envelope for you to review at your convenience later.  YOU SHOULD EXPECT: Some feelings of bloating in the abdomen. Passage of more gas than usual.  Walking can help get rid of the air that was put into your GI tract during the procedure and reduce the bloating. If you had a lower endoscopy (such as a colonoscopy or flexible sigmoidoscopy) you may notice spotting of blood in your stool or on the toilet paper. If you underwent a bowel prep for your procedure, you may not have a normal bowel movement for a few days.  Please Note:  You might notice some irritation and congestion in your nose or some drainage.  This is from the oxygen used during your procedure.  There is no need for concern and it should clear up in a day or so.  SYMPTOMS TO REPORT IMMEDIATELY:   Following lower endoscopy (colonoscopy or flexible sigmoidoscopy):  Excessive amounts of blood in the stool  Significant tenderness or worsening of abdominal pains  Swelling of the abdomen that is new, acute  Fever of 100F or higher   Following upper endoscopy (EGD)  Vomiting of blood or coffee ground material  New chest pain or pain under the shoulder blades  Painful or persistently difficult swallowing  New shortness of breath  Fever of 100F or higher  Black, tarry-looking stools  For urgent or emergent issues, a gastroenterologist can be reached at any hour by calling [phone].  Please read all handouts given to you by your recovery nurse.  DIET:  We do recommend a small meal at  first, but then you may proceed to your regular diet.  Drink plenty of fluids but you should avoid alcoholic beverages for 24 hours.  ACTIVITY:  You should plan to take it easy for the rest of today and you should NOT DRIVE or use heavy machinery until tomorrow (because of the sedation medicines used during the test).    FOLLOW UP: Our staff will call the number listed on your records the next business day following your procedure to check on you and address any questions or concerns that you may have regarding the information given to you following your procedure. If we do not reach you, we will leave a message.  However, if you are feeling well and you are not experiencing any problems, there is no need to return our call.  We will assume that you have returned to your regular daily activities without incident.  If any biopsies were taken you will be contacted by phone or by letter within the next 1-3 weeks.  Please call us at [phone] if you have not heard about the biopsies in 3 weeks.    SIGNATURES/CONFIDENTIALITY: You and/or your care partner have signed paperwork which will be entered into your electronic medical record.  These signatures attest to the fact that that the information above on your After Visit Summary has been reviewed and is understood.  Full responsibility of the confidentiality of this discharge information lies with you and/or your care-partner. Thank   you for letting us take care of your healthcare needs today. 

## 2016-09-10 NOTE — Op Note (Signed)
[facility] Patient Name: Rodney Gardner Procedure Date: 09/10/2016 2:36 PM MRN: [id number] Endoscopist: Mallie Mussel L. Loletha Carrow , MD Age: 31 Referring MD:  Date of Birth: 1986/02/12 Gender: Male Account #: [id number] Procedure:                Colonoscopy Indications:              Generalized abdominal pain Medicines:                Monitored Anesthesia Care Procedure:                Pre-Anesthesia Assessment:                           - Prior to the procedure, a History and Physical                            was performed, and patient medications and                            allergies were reviewed. The patient's tolerance of                            previous anesthesia was also reviewed. The risks                            and benefits of the procedure and the sedation                            options and risks were discussed with the patient.                            All questions were answered, and informed consent                            was obtained. Prior Anticoagulants: The patient has                            taken no previous anticoagulant or antiplatelet                            agents. ASA Grade Assessment: II - A patient with                            mild systemic disease. After reviewing the risks                            and benefits, the patient was deemed in                            satisfactory condition to undergo the procedure.                           After obtaining informed consent, the colonoscope  was passed under direct vision. Throughout the                            procedure, the patient's blood pressure, pulse, and                            oxygen saturations were monitored continuously. The                            Colonoscope was introduced through the anus and                            advanced to the the terminal ileum. The colonoscopy                            was performed without difficulty.  The patient                            tolerated the procedure well. The quality of the                            bowel preparation was good. The terminal ileum,                            ileocecal valve, appendiceal orifice, and rectum                            were photographed. Scope In: 3:00:16 PM Scope Out: 3:10:51 PM Scope Withdrawal Time: 0 hours 8 minutes 11 seconds  Total Procedure Duration: 0 hours 10 minutes 35 seconds  Findings:                 The perianal and digital rectal examinations were                            normal.                           A 4 mm polyp was found in the recto-sigmoid colon.                            The polyp was semi-sessile. The polyp was removed                            with a cold snare. Resection and retrieval were                            complete.                           The exam was otherwise without abnormality on                            direct and retroflexion views. Complications:            No immediate  complications. Estimated Blood Loss:     Estimated blood loss: none. Impression:               - One 4 mm polyp at the recto-sigmoid colon,                            removed with a cold snare. Resected and retrieved.                           - The examination was otherwise normal on direct                            and retroflexion views.                           Patient reports years of symptoms and has had                            unrevealing lab and radiologic testing. No cause                            for symptoms was discovered on either EGD or                            colonoscopy today. His underlying anxiety disorder                            is believed to be a major contributing factor. Recommendation:           - Patient has a contact number available for                            emergencies. The signs and symptoms of potential                            delayed complications were discussed with the                             patient. Return to normal activities tomorrow.                            Written discharge instructions were provided to the                            patient.                           - Resume previous diet.                           - Continue present medications.                           - Await pathology results.                           -  Repeat colonoscopy is recommended for                            surveillance. The colonoscopy date will be                            determined after pathology results from today's                            exam become available for review.                           - Return to primary care physician. Kaida Games L. Loletha Carrow, MD 09/10/2016 3:20:52 PM This report has been signed electronically.

## 2016-09-10 NOTE — Op Note (Signed)
[facility] Patient Name: Rodney Gardner Procedure Date: 09/10/2016 2:40 PM MRN: [id number] Endoscopist: Mallie Mussel L. Loletha Carrow , MD Age: 31 Referring MD:  Date of Birth: [birth year] Gender: Male Account #: [id number] Procedure:                Upper GI endoscopy Indications:              Generalized abdominal pain Medicines:                Monitored Anesthesia Care Procedure:                Pre-Anesthesia Assessment:                           - Prior to the procedure, a History and Physical                            was performed, and patient medications and                            allergies were reviewed. The patient's tolerance of                            previous anesthesia was also reviewed. The risks                            and benefits of the procedure and the sedation                            options and risks were discussed with the patient.                            All questions were answered, and informed consent                            was obtained. Prior Anticoagulants: The patient has                            taken no previous anticoagulant or antiplatelet                            agents. ASA Grade Assessment: II - A patient with                            mild systemic disease. After reviewing the risks                            and benefits, the patient was deemed in                            satisfactory condition to undergo the procedure.                           After obtaining informed consent, the endoscope was  passed under direct vision. Throughout the                            procedure, the patient's blood pressure, pulse, and                            oxygen saturations were monitored continuously. The                            Endoscope was introduced through the mouth, and                            advanced to the second part of duodenum. The upper                            GI endoscopy was accomplished  without difficulty.                            The patient tolerated the procedure well. Scope In: Scope Out: Findings:                 The esophagus was normal.                           The stomach was normal.                           The cardia and gastric fundus were normal on                            retroflexion.                           The examined duodenum was normal. Complications:            No immediate complications. Estimated Blood Loss:     Estimated blood loss: none. Impression:               - Normal esophagus.                           - Normal stomach.                           - Normal examined duodenum.                           - No specimens collected. Recommendation:           - Patient has a contact number available for                            emergencies. The signs and symptoms of potential                            delayed complications were discussed with the  patient. Return to normal activities tomorrow.                            Written discharge instructions were provided to the                            patient.                           - Resume previous diet.                           - Continue present medications.                           - See the other procedure note for documentation of                            additional recommendations. Rasheen Schewe L. Loletha Carrow, MD 09/10/2016 3:15:29 PM This report has been signed electronically.

## 2016-09-11 ENCOUNTER — Telehealth: Payer: Self-pay

## 2016-09-11 NOTE — Telephone Encounter (Signed)
  Follow up Call-  Call back number 09/10/2016  Post procedure Call Back phone  # [phone]  Permission to leave phone message No  Some recent data might be hidden     Patient questions:  Do you have a fever, pain , or abdominal swelling? No. Pain Score  0 *  Have you tolerated food without any problems? Yes.    Have you been able to return to your normal activities? Yes.    Do you have any questions about your discharge instructions: Diet   No. Medications  No. Follow up visit  No.  Do you have questions or concerns about your Care? No.  Actions: * If pain score is 4 or above: No action needed, pain <4.  Pt reported a "sore throat" this am.  I referred to his EGD report which was normal and no bx were taken.  I advised pt to try a warm water gargle or OTC throat lozengers.  If sx persist to call us back.  Pt asked if Dr. Loletha Carrow saw GERD.  I read the findings to the pt from the report which was no no mention of GERD. maw

## 2016-09-13 ENCOUNTER — Encounter: Payer: Self-pay | Admitting: Gastroenterology

## 2016-09-17 ENCOUNTER — Telehealth: Payer: Self-pay | Admitting: Primary Care

## 2016-09-17 DIAGNOSIS — F411 Generalized anxiety disorder: Secondary | ICD-10-CM

## 2016-09-17 NOTE — Telephone Encounter (Signed)
-----   Message from [name], NP sent at 08/20/2016  1:19 PM EST ----- Regarding: GAD How's he doing since we switched to Zoloft from Lexapro?

## 2016-09-18 NOTE — Telephone Encounter (Signed)
Per Anda Kraft, she stated that we can increase the Zoloft but it would be better for patient to come in.

## 2016-09-18 NOTE — Telephone Encounter (Signed)
I think we need to regroup in the office. Please have him schedule an appointment with me at his convenience.

## 2016-09-18 NOTE — Telephone Encounter (Addendum)
Message left for patient to return my call of Kate's comment below.

## 2016-09-18 NOTE — Telephone Encounter (Signed)
Spoken to patient and he stated that he is not noticing any difference at all. He said he is not sure what he should say but he feels about the same.

## 2016-09-24 NOTE — Telephone Encounter (Addendum)
Sent patient a message through [facility].  Message left for patient to return my call of Kate's comments below.

## 2016-10-02 MED ORDER — SERTRALINE HCL 100 MG PO TABS: 100.0000 mg | ORAL_TABLET | Freq: Every day | ORAL | 0 refills | Status: DC

## 2016-10-02 NOTE — Telephone Encounter (Signed)
Noted, will increase dose and call patient in 4 weeks for an update.

## 2016-10-02 NOTE — Telephone Encounter (Signed)
Was able to get a hold of patient. Patient stated that he is agreeable to the increased of Zoloft.  I also notified patient to let us know if the increase dosage did not give him any improvement. He will need to come to the office to be seen and re-eval

## 2016-10-28 ENCOUNTER — Telehealth: Payer: Self-pay | Admitting: Primary Care

## 2016-10-28 DIAGNOSIS — F411 Generalized anxiety disorder: Secondary | ICD-10-CM

## 2016-10-28 NOTE — Telephone Encounter (Signed)
-----   Message from [name], NP sent at 10/02/2016  7:46 PM EDT ----- Regarding: GAD Please check on patient since we increased his dose of Zoloft from 50 mg to 100 mg.

## 2016-10-29 MED ORDER — SERTRALINE HCL 100 MG PO TABS: 100.0000 mg | ORAL_TABLET | Freq: Every day | ORAL | 0 refills | Status: DC

## 2016-10-29 NOTE — Telephone Encounter (Signed)
Noted  

## 2016-10-29 NOTE — Telephone Encounter (Signed)
Spoken to patient and he stated that he is doing okay with increased dosage. Patient stated that he need a refill but I have here that we send on 10/02/2016. Then he stated that he was given 50 mg again but he has been taking 2 of the 50 mg. I went ahead and send a refill of the 100 mg and notified him if any problems let us know.

## 2016-11-05 ENCOUNTER — Other Ambulatory Visit: Payer: Self-pay | Admitting: Primary Care

## 2016-11-05 DIAGNOSIS — F411 Generalized anxiety disorder: Secondary | ICD-10-CM

## 2016-11-05 NOTE — Telephone Encounter (Signed)
Received refill request electronically See phone note 10/28/16 Dose change

## 2016-12-09 ENCOUNTER — Emergency Department
Admission: EM | Admit: 2016-12-09 | Discharge: 2016-12-09 | Disposition: A | Discharge status: Home / Self Care | Payer: 59 | Attending: Emergency Medicine | Admitting: Emergency Medicine

## 2016-12-09 ENCOUNTER — Encounter: Payer: Self-pay | Admitting: Emergency Medicine

## 2016-12-09 ENCOUNTER — Emergency Department: Payer: 59

## 2016-12-09 ENCOUNTER — Encounter: Payer: Self-pay | Admitting: Primary Care

## 2016-12-09 DIAGNOSIS — Z79899 Other long term (current) drug therapy: Secondary | ICD-10-CM | POA: Diagnosis not present

## 2016-12-09 DIAGNOSIS — M549 Dorsalgia, unspecified: Secondary | ICD-10-CM | POA: Diagnosis not present

## 2016-12-09 DIAGNOSIS — R1011 Right upper quadrant pain: Secondary | ICD-10-CM | POA: Diagnosis present

## 2016-12-09 DIAGNOSIS — G8929 Other chronic pain: Secondary | ICD-10-CM | POA: Diagnosis not present

## 2016-12-09 DIAGNOSIS — R109 Unspecified abdominal pain: Secondary | ICD-10-CM

## 2016-12-09 DIAGNOSIS — F17210 Nicotine dependence, cigarettes, uncomplicated: Secondary | ICD-10-CM | POA: Diagnosis not present

## 2016-12-09 DIAGNOSIS — R52 Pain, unspecified: Secondary | ICD-10-CM

## 2016-12-09 LAB — URINALYSIS, COMPLETE (UACMP) WITH MICROSCOPIC
BACTERIA UA: NONE SEEN
BILIRUBIN URINE: NEGATIVE
GLUCOSE, UA: NEGATIVE mg/dL
KETONES UR: 5 mg/dL — AB
Leukocytes, UA: NEGATIVE
Nitrite: NEGATIVE
PH: 5 (ref 5.0–8.0)
PROTEIN: 30 mg/dL — AB
Specific Gravity, Urine: 1.03 (ref 1.005–1.030)

## 2016-12-09 LAB — COMPREHENSIVE METABOLIC PANEL
ALBUMIN: 4.7 g/dL (ref 3.5–5.0)
ALT: 21 U/L (ref 17–63)
AST: 27 U/L (ref 15–41)
Alkaline Phosphatase: 70 U/L (ref 38–126)
Anion gap: 10 (ref 5–15)
BUN: 17 mg/dL (ref 6–20)
CALCIUM: 9.3 mg/dL (ref 8.9–10.3)
CHLORIDE: 99 mmol/L — AB (ref 101–111)
CO2: 27 mmol/L (ref 22–32)
Creatinine, Ser: 1.32 mg/dL — ABNORMAL HIGH (ref 0.61–1.24)
GFR calc non Af Amer: >60 mL/min (ref >60)
GLUCOSE: 97 mg/dL (ref 65–99)
POTASSIUM: 3.8 mmol/L (ref 3.5–5.1)
SODIUM: 136 mmol/L (ref 135–145)
Total Bilirubin: 1.5 mg/dL — ABNORMAL HIGH (ref 0.3–1.2)
Total Protein: 8.1 g/dL (ref 6.5–8.1)

## 2016-12-09 LAB — CBC
HCT: 46.2 % (ref 40.0–52.0)
Hemoglobin: 16.3 g/dL (ref 13.0–18.0)
MCH: 34 pg (ref 26.0–34.0)
MCHC: 35.3 g/dL (ref 32.0–36.0)
MCV: 96.2 fL (ref 80.0–100.0)
Platelets: 301 10*3/uL (ref 150–440)
RBC: 4.81 MIL/uL (ref 4.40–5.90)
RDW: 12.5 % (ref 11.5–14.5)
WBC: 7.8 10*3/uL (ref 3.8–10.6)

## 2016-12-09 MED ORDER — DICYCLOMINE HCL 10 MG/ML IM SOLN
20.0000 mg | Freq: Once | INTRAMUSCULAR | Status: AC
  Administered 2016-12-09: 20 mg via INTRAMUSCULAR
  Filled 2016-12-09: qty 2

## 2016-12-09 NOTE — ED Provider Notes (Signed)
[facility] Emergency Department Provider Note  ____________________________________________   First MD Initiated Contact with Patient 12/09/16 1202     (approximate)  I have reviewed the triage vital signs and the nursing notes.   HISTORY  Chief Complaint Flank Pain    HPI Rodney Gardner is a 31 y.o. male who self presents to the emergency department with 5 years of right upper quadrant aching discomfort radiating around his back to his right upper back. He has been worked up extensively and says he's had multiple CT scans as well as a recent endoscopy and colonoscopy and has not been given a clear diagnosis. As his pain is constant is not postprandial. The only thing that seems to make it worse is movement. He says he has been told that it may be secondary to some component of anxiety but he disagrees with this and his primary care physician recently stopped his anxiety medications. There is nothing different about her symptoms today she is frustrated and wants another opinion. He has never had abdominal surgeries. He reports normal bowel movements and flatus.   Past Medical History:  Diagnosis Date  . Allergy    Seasonal allergies  . Anxiety   . GERD (gastroesophageal reflux disease)   . Irritable bowel syndrome (IBS)     Patient Active Problem List   Diagnosis Date Noted  . GAD (generalized anxiety disorder) 04/05/2016  . Irritable bowel syndrome 04/05/2016  . Changes in skin texture 10/19/2014  . GERD (gastroesophageal reflux disease) 10/19/2014    History reviewed. No pertinent surgical history.  Prior to Admission medications   Medication Sig Start Date End Date Taking? Authorizing Provider  pantoprazole (PROTONIX) 20 MG tablet Take 1 tablet (20 mg total) by mouth daily. Patient not taking: Reported on 12/09/2016 08/06/16   [name], NP  sertraline (ZOLOFT) 100 MG tablet Take 1 tablet (100 mg total) by mouth daily. Patient not  taking: Reported on 12/09/2016 10/29/16   [name], NP    Allergies Wellbutrin [bupropion]  Family History  Problem Relation Age of Onset  . Thyroid cancer Mother   . Anuerysm Paternal Grandfather        brain    Social History Social History  Substance Use Topics  . Smoking status: Current Some Day Smoker    Packs/day: 0.50    Types: Cigarettes  . Smokeless tobacco: Never Used  . Alcohol use 0.0 oz/week     Comment: occ    Review of Systems Constitutional: No fever/chills Eyes: No visual changes. ENT: No sore throat. Cardiovascular: Denies chest pain. Respiratory: Denies shortness of breath. Gastrointestinal: Positive abdominal pain.  No nausea, no vomiting.  No diarrhea.  No constipation. Genitourinary: Negative for dysuria. Musculoskeletal: Positive for back pain. Skin: Negative for rash. Neurological: Negative for headaches, focal weakness or numbness.   ____________________________________________   PHYSICAL EXAM:  VITAL SIGNS: ED Triage Vitals  Enc Vitals Group     BP 12/09/16 0930 (!) 138/94     Pulse Rate 12/09/16 0930 88     Resp 12/09/16 0930 20     Temp 12/09/16 0930 97.9 F (36.6 C)     Temp Source 12/09/16 0930 Oral     SpO2 12/09/16 0930 97 %     Weight 12/09/16 0931 175 lb (79.4 kg)     Height 12/09/16 0931 5\' 9"  (1.753 m)     Head Circumference --      Peak Flow --  Pain Score 12/09/16 0933 6     Pain Loc --      Pain Edu? --      Excl. in [place]? --     Constitutional: Alert and oriented x 4 well appearing nontoxic no diaphoresis speaks in full, clear sentences Eyes: PERRL EOMI. Head: Atraumatic. Nose: No congestion/rhinnorhea. Mouth/Throat: No trismus Neck: No stridor.   Cardiovascular: Normal rate, regular rhythm. Grossly normal heart sounds.  Good peripheral circulation. Respiratory: Normal respiratory effort.  No retractions. Lungs CTAB and moving good air Gastrointestinal: Soft nondistended nontender no rebound or  guarding no peritonitis no McBurney's tenderness negative Murphy's no Rovsing's Musculoskeletal: No lower extremity edema   Neurologic:  Normal speech and language. No gross focal neurologic deficits are appreciated. Skin:  Skin is warm, dry and intact. No rash noted. Psychiatric: Mood and affect are normal. Speech and behavior are normal.     ____________________________________________   LABS (all labs ordered are listed, but only abnormal results are displayed)  Labs Reviewed  COMPREHENSIVE METABOLIC PANEL - Abnormal; Notable for the following:       Result Value   Chloride 99 (*)    Creatinine, Ser 1.32 (*)    Total Bilirubin 1.5 (*)    All other components within normal limits  URINALYSIS, COMPLETE (UACMP) WITH MICROSCOPIC - Abnormal; Notable for the following:    Color, Urine AMBER (*)    APPearance CLEAR (*)    Hgb urine dipstick SMALL (*)    Ketones, ur 5 (*)    Protein, ur 30 (*)    Squamous Epithelial / LPF 0-5 (*)    All other components within normal limits  CBC    Labs unremarkable __________________________________________  EKG   ____________________________________________  RADIOLOGY  Normal right upper quadrant ultrasound ____________________________________________   PROCEDURES  Procedure(s) performed: no  Procedures  Critical Care performed: no  Observation: no ____________________________________________   INITIAL IMPRESSION / ASSESSMENT AND PLAN / ED COURSE  Pertinent labs & imaging results that were available during my care of the patient were reviewed by me and considered in my medical decision making (see chart for details).  The patient is very well-appearing with unremarkable labs and benign vital signs. His symptoms are chronic with unclear etiology. He does report right upper quadrant pain and he is never had an ultrasound so think is reasonable to obtain today.     ----------------------------------------- 1:18 PM on  12/09/2016 -----------------------------------------  The patient's right upper quadrant ultrasound is normal. Unfortunately his pain is not improved with dicyclomine. I had a lengthy discussion with the patient and his wife at bedside regarding chronic pain and the diagnostic uncertainty that persists. He artery has a primary care physician and gastroenterologist he can follow up with. He will be discharged home in good condition. Strict return precautions given. ____________________________________________   FINAL CLINICAL IMPRESSION(S) / ED DIAGNOSES  Final diagnoses:  Pain  Chronic abdominal pain      NEW MEDICATIONS STARTED DURING THIS VISIT:  New Prescriptions   No medications on file     Note:  This document was prepared using Dragon voice recognition software and may include unintentional dictation errors.     Darel Hong, MD 12/09/16 1318

## 2016-12-09 NOTE — ED Notes (Addendum)
Pt ambulating around room after IM injection. Pt able to ambulate independently without difficulty

## 2016-12-09 NOTE — ED Notes (Signed)
Pt reports he has been getting food "stuck" in throat recently and requested RN inform ED MD about situation. MD made aware.

## 2016-12-09 NOTE — Discharge Instructions (Signed)
Follow-up with both your  primary care physician and your gastroenterologist for recheck. Fortunately today your blood work was reassuring and her ultrasound was normal, however unfortunately we do not know exactly why you're having this pain. Please return to the emergency department for any new or worsening symptoms such as if you cannot eat or drink, for fevers or chills, or for any other concerns whatsoever.  It was a pleasure to take care of you today, and thank you for coming to our emergency department.  If you have any questions or concerns before leaving please ask the nurse to grab me and I'm more than happy to go through your aftercare instructions again.  If you were prescribed any opioid pain medication today such as Norco, Vicodin, Percocet, morphine, hydrocodone, or oxycodone please make sure you do not drive when you are taking this medication as it can alter your ability to drive safely.  If you have any concerns once you are home that you are not improving or are in fact getting worse before you can make it to your follow-up appointment, please do not hesitate to call 911 and come back for further evaluation.  Darel Hong MD  Results for orders placed or performed during the hospital encounter of 12/09/16  Comprehensive metabolic panel  Result Value Ref Range   Sodium 136 135 - 145 mmol/L   Potassium 3.8 3.5 - 5.1 mmol/L   Chloride 99 (L) 101 - 111 mmol/L   CO2 27 22 - 32 mmol/L   Glucose, Bld 97 65 - 99 mg/dL   BUN 17 6 - 20 mg/dL   Creatinine, Ser 1.32 (H) 0.61 - 1.24 mg/dL   Calcium 9.3 8.9 - 10.3 mg/dL   Total Protein 8.1 6.5 - 8.1 g/dL   Albumin 4.7 3.5 - 5.0 g/dL   AST 27 15 - 41 U/L   ALT 21 17 - 63 U/L   Alkaline Phosphatase 70 38 - 126 U/L   Total Bilirubin 1.5 (H) 0.3 - 1.2 mg/dL   GFR calc non Af Amer >60 >60 mL/min   GFR calc Af Amer >60 >60 mL/min   Anion gap 10 5 - 15  CBC  Result Value Ref Range   WBC 7.8 3.8 - 10.6 K/uL   RBC 4.81 4.40 - 5.90  MIL/uL   Hemoglobin 16.3 13.0 - 18.0 g/dL   HCT 46.2 40.0 - 52.0 %   MCV 96.2 80.0 - 100.0 fL   MCH 34.0 26.0 - 34.0 pg   MCHC 35.3 32.0 - 36.0 g/dL   RDW 12.5 11.5 - 14.5 %   Platelets 301 150 - 440 K/uL  Urinalysis, Complete w Microscopic  Result Value Ref Range   Color, Urine AMBER (A) YELLOW   APPearance CLEAR (A) CLEAR   Specific Gravity, Urine 1.030 1.005 - 1.030   pH 5.0 5.0 - 8.0   Glucose, UA NEGATIVE NEGATIVE mg/dL   Hgb urine dipstick SMALL (A) NEGATIVE   Bilirubin Urine NEGATIVE NEGATIVE   Ketones, ur 5 (A) NEGATIVE mg/dL   Protein, ur 30 (A) NEGATIVE mg/dL   Nitrite NEGATIVE NEGATIVE   Leukocytes, UA NEGATIVE NEGATIVE   RBC / HPF 0-5 0 - 5 RBC/hpf   WBC, UA 0-5 0 - 5 WBC/hpf   Bacteria, UA NONE SEEN NONE SEEN   Squamous Epithelial / LPF 0-5 (A) NONE SEEN   Mucous PRESENT    Hyaline Casts, UA PRESENT    US Abdomen Limited Ruq  Result Date: 12/09/2016 CLINICAL  DATA:  Abdominal pain EXAM: ULTRASOUND ABDOMEN LIMITED RIGHT UPPER QUADRANT COMPARISON:  Abdominal and pelvic CT scan of Nov 01, 2013 FINDINGS: Gallbladder: No gallstones or wall thickening visualized. No sonographic Murphy sign noted by sonographer. Common bile duct: Diameter: 3.1 mm Liver: No focal lesion identified. Within normal limits in parenchymal echogenicity. IMPRESSION: Normal limited right upper quadrant ultrasound. If there are clinical concerns of chronic gallbladder dysfunction, a nuclear medicine hepatobiliary scan with gallbladder ejection fraction determination may be useful. Electronically Signed   By: David  Martinique M.D.   On: 12/09/2016 12:58

## 2016-12-09 NOTE — ED Notes (Signed)
Patient transported to Ultrasound 

## 2016-12-09 NOTE — ED Notes (Signed)
RN called pharmacy for IM Bentyl. Pharmacist reports they will verify medication and send to ED as soon as possible.

## 2016-12-09 NOTE — ED Triage Notes (Signed)
States R flank pain x years. States has had workups without diagnosis.

## 2016-12-10 NOTE — Telephone Encounter (Signed)
Spoken to patient and schedule a follow up on Thursday 12/12/2016

## 2016-12-10 NOTE — Telephone Encounter (Signed)
Please schedule patient for ED follow up for this week.

## 2016-12-11 ENCOUNTER — Encounter (HOSPITAL_COMMUNITY): Payer: Self-pay | Admitting: *Deleted

## 2016-12-11 ENCOUNTER — Encounter: Payer: Self-pay | Admitting: Primary Care

## 2016-12-11 ENCOUNTER — Encounter: Payer: Self-pay | Admitting: Gastroenterology

## 2016-12-11 ENCOUNTER — Emergency Department (HOSPITAL_COMMUNITY)
Admission: EM | Admit: 2016-12-11 | Discharge: 2016-12-11 | Disposition: A | Discharge status: Home / Self Care | Payer: 59 | Attending: Emergency Medicine | Admitting: Emergency Medicine

## 2016-12-11 DIAGNOSIS — R1011 Right upper quadrant pain: Secondary | ICD-10-CM | POA: Diagnosis present

## 2016-12-11 DIAGNOSIS — F17210 Nicotine dependence, cigarettes, uncomplicated: Secondary | ICD-10-CM | POA: Diagnosis not present

## 2016-12-11 LAB — COMPREHENSIVE METABOLIC PANEL
ALK PHOS: 54 U/L (ref 38–126)
ALT: 17 U/L (ref 17–63)
AST: 20 U/L (ref 15–41)
Albumin: 4 g/dL (ref 3.5–5.0)
Anion gap: 6 (ref 5–15)
BILIRUBIN TOTAL: 1.3 mg/dL — AB (ref 0.3–1.2)
BUN: 11 mg/dL (ref 6–20)
CHLORIDE: 105 mmol/L (ref 101–111)
CO2: 27 mmol/L (ref 22–32)
CREATININE: 1.17 mg/dL (ref 0.61–1.24)
Calcium: 9.1 mg/dL (ref 8.9–10.3)
Glucose, Bld: 98 mg/dL (ref 65–99)
Potassium: 4.4 mmol/L (ref 3.5–5.1)
Sodium: 138 mmol/L (ref 135–145)
TOTAL PROTEIN: 6.6 g/dL (ref 6.5–8.1)

## 2016-12-11 LAB — CBC WITH DIFFERENTIAL/PLATELET
BASOS ABS: 0.1 10*3/uL (ref 0.0–0.1)
Basophils Relative: 1 %
EOS PCT: 2 %
Eosinophils Absolute: 0.1 10*3/uL (ref 0.0–0.7)
HEMATOCRIT: 41.8 % (ref 39.0–52.0)
HEMOGLOBIN: 14.8 g/dL (ref 13.0–17.0)
LYMPHS ABS: 1.2 10*3/uL (ref 0.7–4.0)
LYMPHS PCT: 22 %
MCH: 33.9 pg (ref 26.0–34.0)
MCHC: 35.4 g/dL (ref 30.0–36.0)
MCV: 95.7 fL (ref 78.0–100.0)
Monocytes Absolute: 0.6 10*3/uL (ref 0.1–1.0)
Monocytes Relative: 10 %
NEUTROS ABS: 3.6 10*3/uL (ref 1.7–7.7)
Neutrophils Relative %: 65 %
PLATELETS: 224 10*3/uL (ref 150–400)
RBC: 4.37 MIL/uL (ref 4.22–5.81)
RDW: 11.8 % (ref 11.5–15.5)
WBC: 5.6 10*3/uL (ref 4.0–10.5)

## 2016-12-11 LAB — LIPASE, BLOOD: LIPASE: 32 U/L (ref 11–51)

## 2016-12-11 MED ORDER — ONDANSETRON 4 MG PO TBDP
4.0000 mg | ORAL_TABLET | Freq: Once | ORAL | Status: AC
  Administered 2016-12-11: 4 mg via ORAL
  Filled 2016-12-11: qty 1

## 2016-12-11 MED ORDER — GI COCKTAIL ~~LOC~~
30.0000 mL | Freq: Once | ORAL | Status: AC
  Administered 2016-12-11: 30 mL via ORAL
  Filled 2016-12-11: qty 30

## 2016-12-11 NOTE — Telephone Encounter (Signed)
Please schedule patient for a 30 minute Hospital follow-up visit at his convenience.

## 2016-12-11 NOTE — ED Triage Notes (Signed)
PT was seen at [facility] on Mon for recurrent abd pain.  Pain continues to get worse and increases with eating.

## 2016-12-11 NOTE — ED Notes (Signed)
Pt given sprite to drink. 

## 2016-12-11 NOTE — ED Provider Notes (Signed)
[facility] DEPT Provider Note   CSN: [id number] Arrival date & time: 12/11/16  0746     History   Chief Complaint Chief Complaint  Patient presents with  . Abdominal Pain    HPI Rodney Gardner is a 31 y.o. male.  The history is provided by the patient.  Abdominal Pain   This is a chronic problem. Episode onset: 3 years. The problem occurs constantly. The problem has been gradually worsening. The pain is associated with eating (Typically 30 minutes after eating). The pain is located in the RUQ. The quality of the pain is aching and dull. The pain is moderate. Associated symptoms include anorexia, diarrhea (Chronic) and nausea. Pertinent negatives include fever, melena, vomiting, constipation and dysuria. The symptoms are aggravated by eating. Nothing relieves the symptoms. Past workup includes GI consult and ultrasound. Past workup comments: Patient seen by GI and had an EGD which is unremarkable. Seen at [place] 2 days ago for the same and had negative right upper quadrant ultrasound.. His past medical history is significant for GERD.   Patient also endorses drinking alcohol on the weekends. Endorses daily marijuana use, but states marijuana does not change his symptoms for the better or worse.   Past Medical History:  Diagnosis Date  . Allergy    Seasonal allergies  . Anxiety   . GERD (gastroesophageal reflux disease)   . Irritable bowel syndrome (IBS)     Patient Active Problem List   Diagnosis Date Noted  . GAD (generalized anxiety disorder) 04/05/2016  . Irritable bowel syndrome 04/05/2016  . Changes in skin texture 10/19/2014  . GERD (gastroesophageal reflux disease) 10/19/2014    History reviewed. No pertinent surgical history.     Home Medications    Prior to Admission medications   Medication Sig Start Date End Date Taking? Authorizing Provider  pantoprazole (PROTONIX) 20 MG tablet Take 1 tablet (20 mg total) by mouth daily. Patient not taking:  Reported on 12/09/2016 08/06/16   [name], NP  sertraline (ZOLOFT) 100 MG tablet Take 1 tablet (100 mg total) by mouth daily. Patient not taking: Reported on 12/09/2016 10/29/16   [name], NP    Family History Family History  Problem Relation Age of Onset  . Thyroid cancer Mother   . Anuerysm Paternal Grandfather        brain    Social History Social History  Substance Use Topics  . Smoking status: Current Some Day Smoker    Packs/day: 0.50    Types: Cigarettes  . Smokeless tobacco: Never Used  . Alcohol use 0.0 oz/week     Comment: occ     Allergies   Wellbutrin [bupropion]   Review of Systems Review of Systems  Constitutional: Negative for fever.  Gastrointestinal: Positive for abdominal pain, anorexia, diarrhea (Chronic) and nausea. Negative for constipation, melena and vomiting.  Genitourinary: Negative for dysuria.   All other systems are reviewed and are negative for acute change except as noted in the HPI   Physical Exam Updated Vital Signs BP 134/83 (BP Location: Left Arm)   Pulse 80   Temp 98.3 F (36.8 C) (Oral)   Resp 17   Ht 5\' 9"  (1.753 m)   Wt 79.4 kg (175 lb)   SpO2 99%   BMI 25.84 kg/m   Physical Exam  Constitutional: He is oriented to person, place, and time. He appears well-developed and well-nourished. No distress.  HENT:  Head: Normocephalic and atraumatic.  Nose: Nose normal.  Eyes: Conjunctivae and  EOM are normal. Pupils are equal, round, and reactive to light. Right eye exhibits no discharge. Left eye exhibits no discharge. No scleral icterus.  Neck: Normal range of motion. Neck supple.  Cardiovascular: Normal rate and regular rhythm.  Exam reveals no gallop and no friction rub.   No murmur heard. Pulmonary/Chest: Effort normal and breath sounds normal. No stridor. No respiratory distress. He has no rales.  Abdominal: Soft. He exhibits no distension. There is no hepatosplenomegaly. There is tenderness in the right  upper quadrant. There is no rigidity, no rebound, no guarding, no CVA tenderness and negative Murphy's sign.  Musculoskeletal: He exhibits no edema or tenderness.  Neurological: He is alert and oriented to person, place, and time.  Skin: Skin is warm and dry. No rash noted. He is not diaphoretic. No erythema.  Psychiatric: He has a normal mood and affect.  Vitals reviewed.    ED Treatments / Results  Labs (all labs ordered are listed, but only abnormal results are displayed) Labs Reviewed  COMPREHENSIVE METABOLIC PANEL - Abnormal; Notable for the following:       Result Value   Total Bilirubin 1.3 (*)    All other components within normal limits  CBC WITH DIFFERENTIAL/PLATELET  LIPASE, BLOOD    EKG  EKG Interpretation None       Radiology US Abdomen Limited Ruq  Result Date: 12/09/2016 CLINICAL DATA:  Abdominal pain EXAM: ULTRASOUND ABDOMEN LIMITED RIGHT UPPER QUADRANT COMPARISON:  Abdominal and pelvic CT scan of Nov 01, 2013 FINDINGS: Gallbladder: No gallstones or wall thickening visualized. No sonographic Murphy sign noted by sonographer. Common bile duct: Diameter: 3.1 mm Liver: No focal lesion identified. Within normal limits in parenchymal echogenicity. IMPRESSION: Normal limited right upper quadrant ultrasound. If there are clinical concerns of chronic gallbladder dysfunction, a nuclear medicine hepatobiliary scan with gallbladder ejection fraction determination may be useful. Electronically Signed   By: David  Martinique M.D.   On: 12/09/2016 12:58    Procedures Procedures (including critical care time)  Medications Ordered in ED Medications  ondansetron (ZOFRAN-ODT) disintegrating tablet 4 mg (4 mg Oral Given 12/11/16 0856)  gi cocktail (Maalox,Lidocaine,Donnatal) (30 mLs Oral Given 12/11/16 0856)     Initial Impression / Assessment and Plan / ED Course  I have reviewed the triage vital signs and the nursing notes.  Pertinent labs & imaging results that were available  during my care of the patient were reviewed by me and considered in my medical decision making (see chart for details).     Chronic right upper quadrant abdominal pain. Exam without evidence of peritonitis. Labs grossly reassuring. He did note mild hyperbilirubinemia however this is improving from 2 days ago. Able to tolerate by mouth  Discussed other possibilities of patient's pain including gastritis, sphincter OD dysfunction, IBS, cannabis related abdominal pain.  Instructed to follow up closely with PCP and GI for further workup and management.  The patient is safe for discharge with strict return precautions.   Final Clinical Impressions(s) / ED Diagnoses   Final diagnoses:  RUQ abdominal pain   Disposition: Discharge  Condition: Good  I have discussed the results, Dx and Tx plan with the patient who expressed understanding and agree(s) with the plan. Discharge instructions discussed at great length. The patient was given strict return precautions who verbalized understanding of the instructions. No further questions at time of discharge.    New Prescriptions   No medications on file    Follow Up: [name], NP [address]  house [place] [address] [phone]  Schedule an appointment as soon as possible for a visit        Cardama, Grayce Sessions, MD 12/11/16 1020

## 2016-12-12 ENCOUNTER — Encounter: Payer: Self-pay | Admitting: Primary Care

## 2016-12-12 ENCOUNTER — Ambulatory Visit (INDEPENDENT_AMBULATORY_CARE_PROVIDER_SITE_OTHER): Payer: 59 | Admitting: Primary Care

## 2016-12-12 VITALS — BP 118/74 | HR 81 | Temp 97.9°F | Ht 68.0 in | Wt 173.0 lb

## 2016-12-12 DIAGNOSIS — R1011 Right upper quadrant pain: Secondary | ICD-10-CM | POA: Diagnosis not present

## 2016-12-12 DIAGNOSIS — K219 Gastro-esophageal reflux disease without esophagitis: Secondary | ICD-10-CM | POA: Diagnosis not present

## 2016-12-12 DIAGNOSIS — R109 Unspecified abdominal pain: Secondary | ICD-10-CM

## 2016-12-12 DIAGNOSIS — T17308A Unspecified foreign body in larynx causing other injury, initial encounter: Secondary | ICD-10-CM | POA: Diagnosis not present

## 2016-12-12 DIAGNOSIS — F411 Generalized anxiety disorder: Secondary | ICD-10-CM

## 2016-12-12 DIAGNOSIS — G8929 Other chronic pain: Secondary | ICD-10-CM

## 2016-12-12 LAB — POC URINALSYSI DIPSTICK (AUTOMATED)
Bilirubin, UA: NEGATIVE
GLUCOSE UA: NEGATIVE
Ketones, UA: NEGATIVE
Leukocytes, UA: NEGATIVE
NITRITE UA: NEGATIVE
Protein, UA: NEGATIVE
UROBILINOGEN UA: NEGATIVE U/dL — AB
pH, UA: 5.5 (ref 5.0–8.0)

## 2016-12-12 LAB — TSH: TSH: 1.57 u[IU]/mL (ref 0.35–4.50)

## 2016-12-12 MED ORDER — SERTRALINE HCL 100 MG PO TABS: 100.0000 mg | ORAL_TABLET | Freq: Every day | ORAL | 1 refills | Status: DC

## 2016-12-12 NOTE — Progress Notes (Signed)
Subjective:    Patient ID: Rodney Gardner, male    DOB: 1985-12-11, 31 y.o.   MRN: [id number]  HPI  Rodney Gardner is a 31 year old male who presents today for emergency department follow up and chronic abdominal pain.  He initially presented to [facility] ED on 12/09/16 with complaints of RUQ abdominal and right flank pain. His pain is worse with movement. During his stay in the ED he underwent UA, CBC, CMP, RUQ ultrasound which were unremarkable. He was treated with dicyclomine without improvement. He was discharged home later that afternoon.  He presented to [facility] on 12/11/16 with complaints of RUQ abdominal pain and flank pain with decrease in appetite, nausea, vomiting that was worse after eating. His labs were repeated which noted improvement in hyperbilirubinemia, and otherwise unremarkable.   His chronic abdominal pain is located to the RUQ with radiation of pain through to his right thoracic back. His pain is constant and describes his pain like a pressure. He endorses nausea and vomiting that began one week ago. His last episode of vomiting was two days ago. He denies constipation, diarrhea, bloody stools. He's drinking 48 ounce beers on Friday and Saturday. He smokes marijuana everyday for which he has done since age 38. He does a lot of repetitive heavy lifting at work and has done so for years.  He's also experiencing choking episodes and can only eat small portion sizes. He feels the food "catches" to the esophagus around the clavicle. He will often have to spit up his food. He underwent endoscopy and colonoscopy in March 2018. Endoscopy was unremarkable, colonoscopy with benign polyp which was removed.  He stopped taking his Zoloft 2 months ago as he never followed up as recommended. His anxiety has been worse and is requesting refills.      Review of Systems  Constitutional: Negative for fatigue and fever.  HENT: Positive for trouble swallowing.   Respiratory: Positive for choking.     Gastrointestinal: Positive for abdominal pain and nausea. Negative for blood in stool, constipation, diarrhea and vomiting.  Neurological: Negative for weakness.       Past Medical History:  Diagnosis Date  . Allergy    Seasonal allergies  . Anxiety   . GERD (gastroesophageal reflux disease)   . Irritable bowel syndrome (IBS)      Social History   Social History  . Marital status: Single    Spouse name: N/A  . Number of children: 1  . Years of education: N/A   Occupational History  . Not on file.   Social History Main Topics  . Smoking status: Current Some Day Smoker    Packs/day: 0.50    Types: Cigarettes  . Smokeless tobacco: Never Used  . Alcohol use 0.0 oz/week     Comment: occ  . Drug use: No  . Sexual activity: Not on file   Other Topics Concern  . Not on file   Social History Narrative   Single.   Works on [employer] has been for 10 years.   24 son 2 years old.   Enjoys working out, being outside.   [place] a place at the beach.    No past surgical history on file.  Family History  Problem Relation Age of Onset  . Thyroid cancer Mother   . Anuerysm Paternal Grandfather        brain    Allergies  Allergen Reactions  . Wellbutrin [Bupropion] Hives    Current Outpatient Prescriptions on File  Prior to Visit  Medication Sig Dispense Refill  . pantoprazole (PROTONIX) 20 MG tablet Take 1 tablet (20 mg total) by mouth daily. (Patient not taking: Reported on 12/09/2016) 90 tablet 0   Current Facility-Administered Medications on File Prior to Visit  Medication Dose Route Frequency Provider Last Rate Last Dose  . 0.9 %  sodium chloride infusion  500 mL Intravenous Continuous Danis, Estill Cotta III, MD        BP 118/74   Pulse 81   Temp 97.9 F (36.6 C) (Oral)   Ht 5\' 8"  (1.727 m)   Wt 173 lb (78.5 kg)   SpO2 97%   BMI 26.30 kg/m    Objective:   Physical Exam  Constitutional: He appears well-nourished. He does not have a sickly appearance. He does  not appear ill.  Neck: Neck supple.  Cardiovascular: Normal rate and regular rhythm.   Pulmonary/Chest: Effort normal and breath sounds normal.  Abdominal: Soft. Bowel sounds are normal. He exhibits no mass. There is tenderness. There is no rebound and no guarding.    Pain lateral to right side of umbillicus and RUQ. No rebound tenderness.  Skin: Skin is warm and dry.          Assessment & Plan:

## 2016-12-12 NOTE — Assessment & Plan Note (Signed)
Not taking pantoprazole, no improvement. Choking episodes occurring more frequently. Endoscopy in 2018 unremarkable. Recommended he try another PPI OTC. Check TSH today given family history of thyroid cancer in his mother. No thyroidmegaly on exam.

## 2016-12-12 NOTE — Assessment & Plan Note (Signed)
Persistent RUQ abdominal pain without clear etiology. RUQ ultrasound and labs unremarkable. Endoscopy and colonoscopy in March 2018 unremarkable. Consider repeat CT scan vs HIDA scan. Consulted with GI who recommended aminolevulinic acid and porphobilinogen urine tests which are now pending. Patient is agreeable to seeing a GI specialist.  Will consult again with GI and update on examination. UA's over the past 1-2 years noted with trace-1+ blood. Will consider Urology referral for further evaluation.  He does not appear ill/sickly and is in no acute distress. Vitals stable.

## 2016-12-12 NOTE — Patient Instructions (Addendum)
I will be in touch with you once I receive the urine specimen results.  I'll be in touch with your gastroenterologist later today, I'll update you once we have discussed your case.  Start Zoloft 100 mg tablets. Start by taking 1/2 tablet by mouth once daily for 8 days, then advance to 1 full tablet thereafter.  It was a pleasure to see you today!

## 2016-12-12 NOTE — Assessment & Plan Note (Signed)
Stopped taking Zoloft 2 months ago, anxiety is worse.  There were no plans on stopping this medication so we will resume this today at 50 mg daily for 8 days, then increase to 100 mg thereafter.

## 2016-12-13 ENCOUNTER — Telehealth: Payer: Self-pay | Admitting: Primary Care

## 2016-12-13 NOTE — Telephone Encounter (Signed)
Please notify patient that I've attempted to contact him via My Chart and haven't heard back. I'd like to proceed with a CT scan of his abdomen and pelvis as discussed. Please notify me if he's agreeable and I'll place the orders.

## 2016-12-15 ENCOUNTER — Encounter: Payer: Self-pay | Admitting: Primary Care

## 2016-12-15 DIAGNOSIS — R1084 Generalized abdominal pain: Secondary | ICD-10-CM

## 2016-12-15 DIAGNOSIS — R319 Hematuria, unspecified: Secondary | ICD-10-CM

## 2016-12-17 NOTE — Telephone Encounter (Signed)
Will close encounter. Patient reply to [facility] comments through [place] [facility].

## 2016-12-19 ENCOUNTER — Ambulatory Visit: Payer: BLUE CROSS/BLUE SHIELD | Admitting: Primary Care

## 2016-12-23 ENCOUNTER — Ambulatory Visit (INDEPENDENT_AMBULATORY_CARE_PROVIDER_SITE_OTHER)
Admission: RE | Admit: 2016-12-23 | Discharge: 2016-12-23 | Disposition: A | Discharge status: Home / Self Care | Payer: 59 | Source: Ambulatory Visit | Attending: Primary Care | Admitting: Primary Care

## 2016-12-23 DIAGNOSIS — R1084 Generalized abdominal pain: Secondary | ICD-10-CM | POA: Diagnosis not present

## 2016-12-23 MED ORDER — IOPAMIDOL (ISOVUE-300) INJECTION 61%
100.0000 mL | Freq: Once | INTRAVENOUS | Status: AC | PRN
Start: 2016-12-23 — End: 2016-12-23
  Administered 2016-12-23: 100 mL via INTRAVENOUS

## 2016-12-24 ENCOUNTER — Encounter: Payer: Self-pay | Admitting: Primary Care

## 2016-12-24 LAB — PORPHOBILINOGEN, 24 HR URINE-QUANT
Porphobilinogen, 24 hr Ur: 0.8 mg/24 h (ref <2.4)
Total Volume - PORPQ: 800 mL

## 2017-01-09 LAB — AMINOLEVULINIC ACID, 24 HOUR
Creatinine, Urine mg/day-ALA: 1.816 g/(24.h) (ref 0.800–2.000)
DELTA ALA 24H UR: 0.29 mg/dL (ref 0.03–0.54)
Total Volume - ALA: 800 mL

## 2017-01-14 ENCOUNTER — Encounter: Payer: Self-pay | Admitting: Urology

## 2017-01-14 ENCOUNTER — Ambulatory Visit (INDEPENDENT_AMBULATORY_CARE_PROVIDER_SITE_OTHER): Payer: 59 | Admitting: Urology

## 2017-01-14 VITALS — BP 127/84 | HR 75 | Ht 69.0 in | Wt 175.0 lb

## 2017-01-14 DIAGNOSIS — R3129 Other microscopic hematuria: Secondary | ICD-10-CM | POA: Diagnosis not present

## 2017-01-14 LAB — URINALYSIS, COMPLETE
BILIRUBIN UA: NEGATIVE
GLUCOSE, UA: NEGATIVE
KETONES UA: NEGATIVE
Leukocytes, UA: NEGATIVE
Nitrite, UA: NEGATIVE
PROTEIN UA: NEGATIVE
Specific Gravity, UA: 1.015 (ref 1.005–1.030)
UUROB: 0.2 mg/dL (ref 0.2–1.0)
pH, UA: 7 (ref 5.0–7.5)

## 2017-01-14 NOTE — Progress Notes (Signed)
01/14/2017 9:03 AM   [name] 1986-01-01 [id number]  Referring provider: [name], NP [place], [place] [address]  Chief complaint: Microscopic hematuria  HPI: Consultation for abnormal urinalysis for for by a nurse practitioner Alma Friendly. Patient was seen by NP Carlis Abbott 12/12/2016 and she has a well-documented history of his abdominal pain. He had no lower urinary tract symptoms, but appropriately underwent a urinalysis. This revealed 10 Ery/uL, but the lab indicates it was a dipstick only. Doing some research with our lab tech and online it seems that 10 Ery/uL can indicate the presence of 10 red blood cells. Because of his abdominal pain the patient did undergo a CT scan of the abdomen and pelvis 12/23/2016 and I reviewed the images. This showed a normal urinary tract. I reviewed the images. Of note the patient had a urinalysis in 2016 that showed blood on the dip but 0-5 red blood cells on microscopy (3 or greater is considered abnormal). He had a more accurately depicted urinalysis in 2015 which showed blood on the tip but only 1 red blood cell per high-powered field which is normal. The patient is a smoker. His urinalysis today shows 1+ blood on the dip but no microscopic hematuria (0 red blood cells per hpf).  Today, the patient is seen for the above. Again he denies any weak stream, frequency urgency dysuria or gross hematuria. He's had no flank pain. He does have low back pain on occasion.   Modifying factors: There are no other modifying factors  Associated signs and symptoms: There are no other associated signs and symptoms Aggravating and relieving factors: There are no other aggravating or relieving factors Severity: Mild Duration: Persistent   PMH: Past Medical History:  Diagnosis Date  . Allergy    Seasonal allergies  . Anxiety   . GERD (gastroesophageal reflux disease)   . Irritable bowel syndrome (IBS)     Surgical History: No past  surgical history on file.  Home Medications:  Allergies as of 01/14/2017      Reactions   Wellbutrin [bupropion] Hives      Medication List       Accurate as of 01/14/17  9:03 AM. Always use your most recent med list.          pantoprazole 20 MG tablet Commonly known as:  PROTONIX Take 1 tablet (20 mg total) by mouth daily.   sertraline 100 MG tablet Commonly known as:  ZOLOFT Take 1 tablet (100 mg total) by mouth daily.       Allergies:  Allergies  Allergen Reactions  . Wellbutrin [Bupropion] Hives    Family History: Family History  Problem Relation Age of Onset  . Thyroid cancer Mother   . Anuerysm Paternal Grandfather        brain    Social History:  reports that he has been smoking Cigarettes.  He has been smoking about 0.50 packs per day. He has never used smokeless tobacco. He reports that he drinks alcohol. He reports that he does not use drugs.  ROS:                                        Physical Exam: There were no vitals taken for this visit.  Constitutional:  Alert and oriented, No acute distress. HEENT: [place] AT, moist mucus membranes.  Trachea midline, no masses. Cardiovascular: No clubbing,  cyanosis, or edema. Respiratory: Normal respiratory effort, no increased work of breathing. GI: Abdomen is soft, nontender, nondistended, no abdominal masses GU: No CVA tenderness. Penis Scrotum Testicles Skin: No rashes, bruises or suspicious lesions. Lymph: No cervical or inguinal adenopathy. Neurologic: Grossly intact, no focal deficits, moving all 4 extremities. Psychiatric: Normal mood and affect.  Laboratory Data: Lab Results  Component Value Date   WBC 5.6 12/11/2016   HGB 14.8 12/11/2016   HCT 41.8 12/11/2016   MCV 95.7 12/11/2016   PLT 224 12/11/2016    Lab Results  Component Value Date   CREATININE 1.17 12/11/2016    No results found for: PSA  No results found for: TESTOSTERONE  No results found for:  HGBA1C  Urinalysis    Component Value Date/Time   COLORURINE AMBER (A) 12/09/2016 0931   APPEARANCEUR CLEAR (A) 12/09/2016 0931   APPEARANCEUR Clear 10/13/2014 1110   LABSPEC 1.030 12/09/2016 0931   LABSPEC 1.015 10/13/2014 1110   PHURINE 5.0 12/09/2016 0931   GLUCOSEU NEGATIVE 12/09/2016 0931   GLUCOSEU Negative 10/13/2014 1110   HGBUR SMALL (A) 12/09/2016 0931   BILIRUBINUR negative 12/12/2016 1215   BILIRUBINUR Negative 10/13/2014 1110   KETONESUR 5 (A) 12/09/2016 0931   PROTEINUR negative 12/12/2016 1215   PROTEINUR 30 (A) 12/09/2016 0931   UROBILINOGEN negative (A) 12/12/2016 1215   NITRITE negaitve 12/12/2016 1215   NITRITE NEGATIVE 12/09/2016 0931   LEUKOCYTESUR Negative 12/12/2016 1215   LEUKOCYTESUR Negative 10/13/2014 1110    Pertinent Imaging: CT  Assessment & Plan:    1. Microscopic hematuria I discussed with the patient and his significant other the nature of microscopic hematuria and potential etiologies both benign and malignant. We discussed nothing at the current time replaces direct cystoscopic evaluation of the urethra and bladder and I recommended a cystoscopy. We discussed link between smoking and GU cancers and I recommended he stop. I'm not truly convinced he has microscopic hematuria but given the equivocal lab values and his smoking history I think we need to complete the workup. He also asked about alcohol consumption and I recommended against more than 2-3 drinks in one day, but told him alcohol typically doesn't affect the kidneys nor cause blood in the urine but can affect the liver and other organs.  - Urinalysis, Complete   No Follow-up on file.  Festus Aloe, [facility] [address] [place], [place] [address] [phone]

## 2017-01-15 ENCOUNTER — Encounter: Payer: Self-pay | Admitting: Gastroenterology

## 2017-01-16 NOTE — Telephone Encounter (Signed)
Rodney Gardner,    This patient has requested referral to an academic medical center for another opinion on his chronic abdominal pain. Please send the referral to the [facility] along with my EGD/colonoscopy reports, my office note and the recent abdominal CT scan report.

## 2017-01-17 ENCOUNTER — Telehealth: Payer: Self-pay

## 2017-01-17 NOTE — Telephone Encounter (Signed)
Faxed referral information to [facility] clinic, [phone].

## 2017-01-28 ENCOUNTER — Encounter: Payer: Self-pay | Admitting: Urology

## 2017-01-28 ENCOUNTER — Ambulatory Visit (INDEPENDENT_AMBULATORY_CARE_PROVIDER_SITE_OTHER): Payer: 59 | Admitting: Urology

## 2017-01-28 DIAGNOSIS — R3129 Other microscopic hematuria: Secondary | ICD-10-CM | POA: Insufficient documentation

## 2017-01-28 LAB — URINALYSIS, COMPLETE
BILIRUBIN UA: NEGATIVE
GLUCOSE, UA: NEGATIVE
KETONES UA: NEGATIVE
LEUKOCYTES UA: NEGATIVE
Nitrite, UA: NEGATIVE
PROTEIN UA: NEGATIVE
Specific Gravity, UA: 1.025 (ref 1.005–1.030)
Urobilinogen, Ur: 0.2 mg/dL (ref 0.2–1.0)
pH, UA: 6 (ref 5.0–7.5)

## 2017-01-28 LAB — MICROSCOPIC EXAMINATION

## 2017-01-28 MED ORDER — CIPROFLOXACIN HCL 500 MG PO TABS
500.0000 mg | ORAL_TABLET | Freq: Once | ORAL | Status: AC
  Administered 2017-01-28: 500 mg via ORAL

## 2017-01-28 MED ORDER — LIDOCAINE HCL 2 % EX GEL
1.0000 "application " | Freq: Once | CUTANEOUS | Status: AC
  Administered 2017-01-28: 1 via URETHRAL

## 2017-01-28 NOTE — Progress Notes (Signed)
01/28/2017 8:05 AM   [name] Dec 16, 1985 [id number]  Referring provider: [name], NP [place], [place] [address]  CC: Follow up and cysto  HPI:  1 - Microscopic Hematuria - blood on UA noted x many. No gross hematuria. No large proteinuria or HTN. CT 2015 and 2018 unremarkable (no stones / upper tract masses). Cysto 12/2016 (today) normal.  PMH sig for chronic abd pain, axiety / SSRI.  Today "Rodney Gardner" is seen for cysto to complete hematuria evaluation.    PMH: Past Medical History:  Diagnosis Date  . Allergy    Seasonal allergies  . Anxiety   . GERD (gastroesophageal reflux disease)   . Irritable bowel syndrome (IBS)     Surgical History: Past Surgical History:  Procedure Laterality Date  . none      Home Medications:  Allergies as of 01/28/2017      Reactions   Wellbutrin [bupropion] Hives      Medication List       Accurate as of 01/28/17  8:05 AM. Always use your most recent med list.          sertraline 100 MG tablet Commonly known as:  ZOLOFT Take 1 tablet (100 mg total) by mouth daily.       Allergies:  Allergies  Allergen Reactions  . Wellbutrin [Bupropion] Hives    Family History: Family History  Problem Relation Age of Onset  . Thyroid cancer Mother   . Anuerysm Paternal Grandfather        brain  . Prostate cancer Neg Hx   . Kidney cancer Neg Hx     Social History:  reports that he has been smoking Cigarettes.  He has been smoking about 0.50 packs per day. He has never used smokeless tobacco. He reports that he drinks alcohol. He reports that he does not use drugs.   Review of Systems  Gastrointestinal (upper)  : Negative for upper GI symptoms  Gastrointestinal (lower) : Negative for lower GI symptoms  Constitutional : Negative for symptoms  Skin: Negative for skin symptoms  Eyes: Negative for eye symptoms  Ear/Nose/Throat : Negative for Ear/Nose/Throat  symptoms  Hematologic/Lymphatic: Negative for Hematologic/Lymphatic symptoms  Cardiovascular : Negative for cardiovascular symptoms  Respiratory : Negative for respiratory symptoms  Endocrine: Negative for endocrine symptoms  Musculoskeletal: Negative for musculoskeletal symptoms  Neurological: Negative for neurological symptoms  Psychologic: Negative for psychiatric symptoms      Physical Exam: There were no vitals taken for this visit.  Constitutional:  Alert and oriented, No acute distress. HEENT: [place] AT, moist mucus membranes.  Trachea midline, no masses. Cardiovascular: No clubbing, cyanosis, or edema. Respiratory: Normal respiratory effort, no increased work of breathing. GI: Abdomen is soft, nontender, nondistended, no abdominal masses GU: No CVA tenderness.  Skin: No rashes, bruises or suspicious lesions. Lymph: No cervical or inguinal adenopathy. Neurologic: Grossly intact, no focal deficits, moving all 4 extremities. Psychiatric: Normal mood and affect.  Laboratory Data: Lab Results  Component Value Date   WBC 5.6 12/11/2016   HGB 14.8 12/11/2016   HCT 41.8 12/11/2016   MCV 95.7 12/11/2016   PLT 224 12/11/2016    Lab Results  Component Value Date   CREATININE 1.17 12/11/2016    No results found for: PSA  No results found for: TESTOSTERONE  No results found for: HGBA1C  Urinalysis    Component Value Date/Time   COLORURINE AMBER (A) 12/09/2016 [place] 01/14/2017 0855  LABSPEC 1.030 12/09/2016 0931   LABSPEC 1.015 10/13/2014 1110   PHURINE 5.0 12/09/2016 0931   GLUCOSEU Negative 01/14/2017 0855   GLUCOSEU Negative 10/13/2014 1110   HGBUR SMALL (A) 12/09/2016 0931   BILIRUBINUR Negative 01/14/2017 0855   BILIRUBINUR Negative 10/13/2014 1110   KETONESUR 5 (A) 12/09/2016 0931   PROTEINUR Negative 01/14/2017 0855   PROTEINUR 30 (A) 12/09/2016 0931   UROBILINOGEN negative (A) 12/12/2016 1215   NITRITE Negative  01/14/2017 0855   NITRITE NEGATIVE 12/09/2016 0931   LEUKOCYTESUR Negative 01/14/2017 0855   LEUKOCYTESUR Negative 10/13/2014 1110    Pertinent Imaging: CT 2015 and 2018 reveiwed as per above    01/28/17  CC:  Chief Complaint  Patient presents with  . Cysto    HPI:  Blood pressure 108/69, pulse 79, height 5\' 9"  (1.753 m), weight 77.8 kg (171 lb 8 oz). NED. A&Ox3.   No respiratory distress   Abd soft, NT, ND Normal phallus with bilateral descended testicles  Cystoscopy Procedure Note  Patient identification was confirmed, informed consent was obtained, and patient was prepped using Betadine solution.  Lidocaine jelly was administered per urethral meatus.    Preoperative abx where received prior to procedure.     Pre-Procedure: - Inspection reveals a normal caliber ureteral meatus.  Procedure: The flexible cystoscope was introduced without difficulty - No urethral strictures/lesions are present. - Normal prostate  - Normal bladder neck - Bilateral ureteral orifices identified - Bladder mucosa  reveals no ulcers, tumors, or lesions - No bladder stones - No trabeculation  Retroflexion shows no additional findings   Post-Procedure: - Patient tolerated the procedure well  Assessment/ Plan:   Assessment & Plan:    1. Microscopic hematuria - s/p unremarkable eval with labs, exam, CT, cysto. Explaiend that this will likley be lifelong and that further eval not necessary unless develops gross / visible blood, severe HTN, or large proteinuria.  RTC 1 year with NP to verify no interval gross hematuria, then PRN if stable.    Alexis Frock, [facility] [address], [place], [place] [address] [phone]

## 2017-06-17 ENCOUNTER — Other Ambulatory Visit: Payer: Self-pay | Admitting: Primary Care

## 2017-06-17 DIAGNOSIS — F411 Generalized anxiety disorder: Secondary | ICD-10-CM

## 2017-06-17 MED ORDER — SERTRALINE HCL 100 MG PO TABS: 100.0000 mg | ORAL_TABLET | Freq: Every day | ORAL | 1 refills | Status: DC

## 2018-01-28 ENCOUNTER — Ambulatory Visit: Payer: 59 | Admitting: Urology

## 2018-01-28 ENCOUNTER — Encounter: Payer: Self-pay | Admitting: Urology

## 2018-02-10 ENCOUNTER — Encounter: Payer: Self-pay | Admitting: Urology

## 2018-02-10 NOTE — Progress Notes (Signed)
Certified letter sent 02/11/2018.

## 2018-12-15 IMAGING — US US ABDOMEN LIMITED
1 series · 14 of 25 positions shown · non-contrast
Comparison: Abdominal and pelvic CT scan November 01, 2013

CLINICAL DATA: Abdominal pain

EXAM:
ULTRASOUND ABDOMEN LIMITED RIGHT UPPER QUADRANT

[Series 1: us abdomen limited · 0.22mm/px · 14 of 39 slices shown]
[im 1/39]
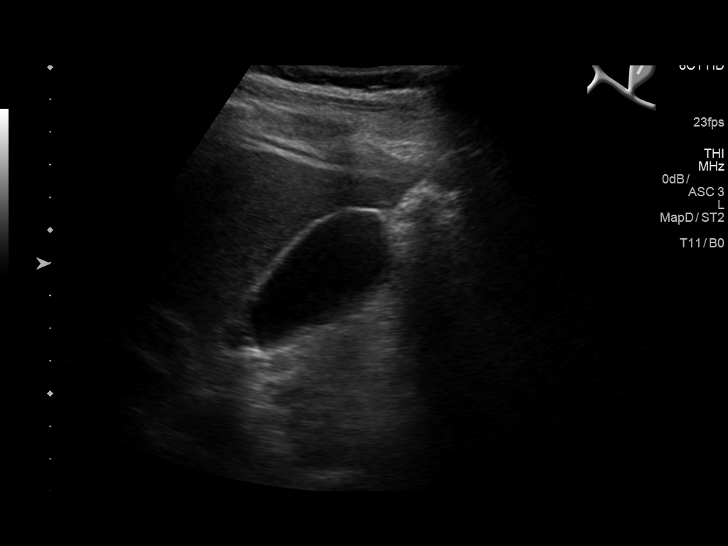
[im 4/39]
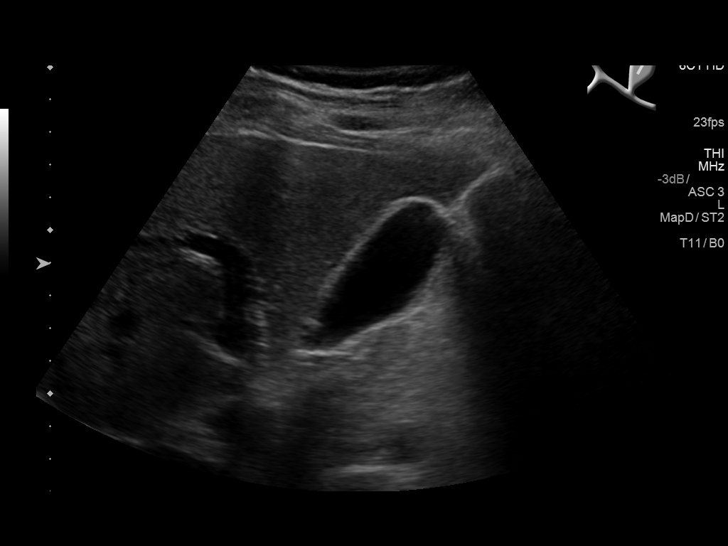
[im 7/39]
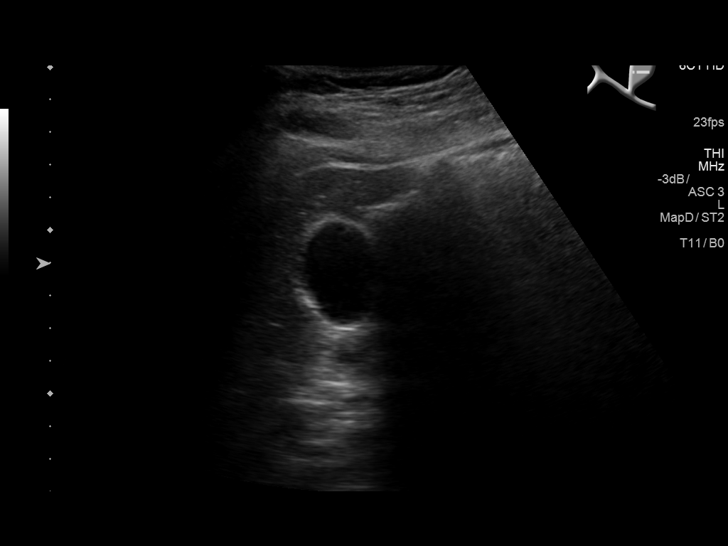
[im 10/39]
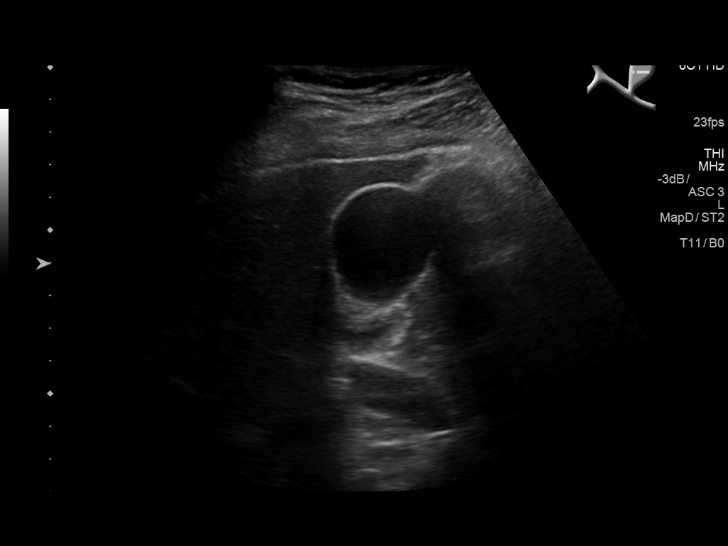
[im 13/39]
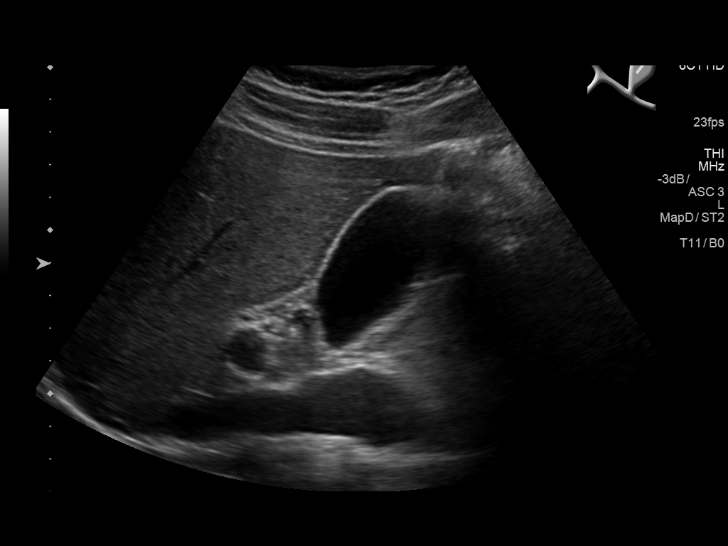
[im 15/39]
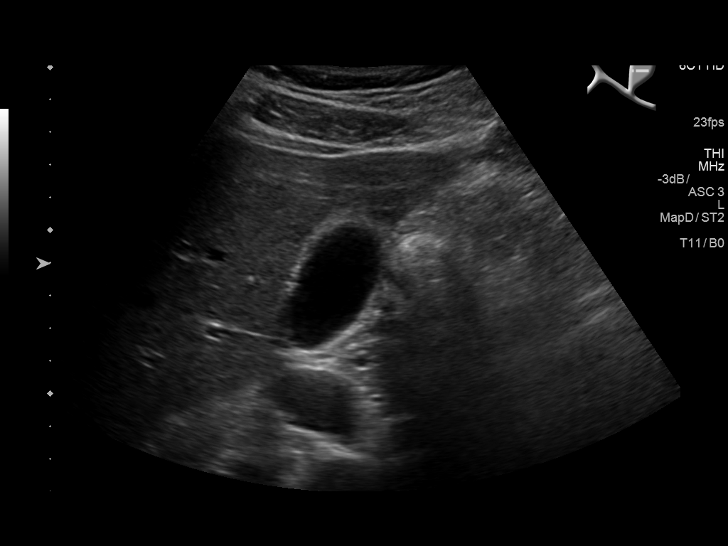
[im 18/39]
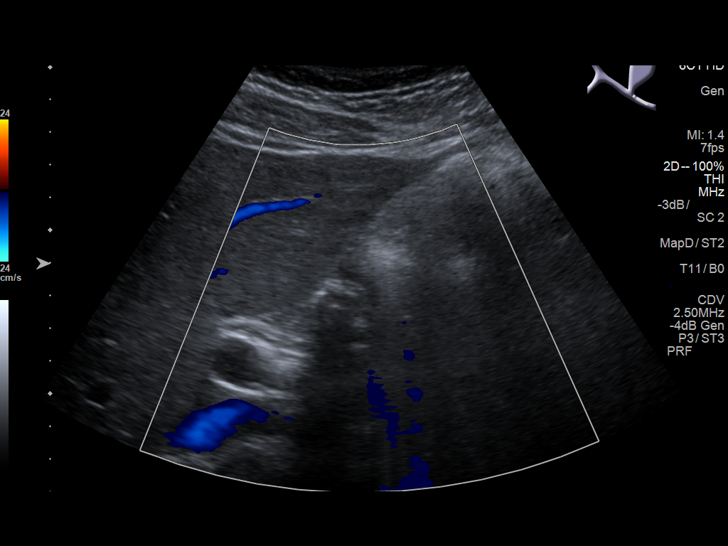
[im 21/39]
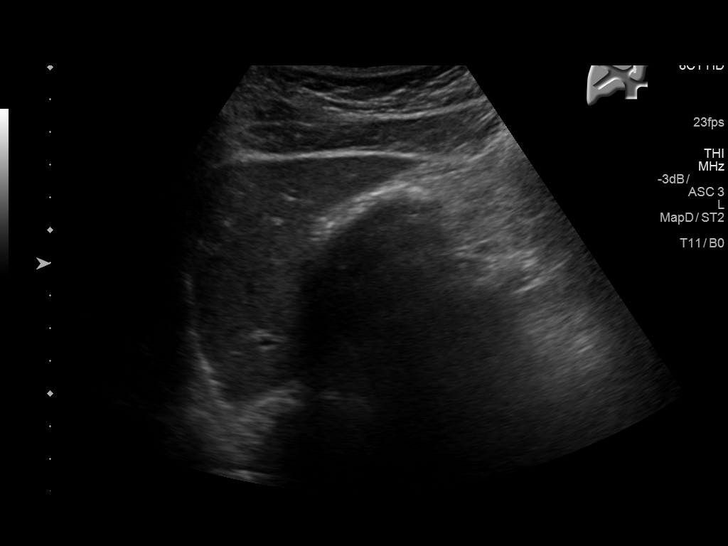
[im 24/39]
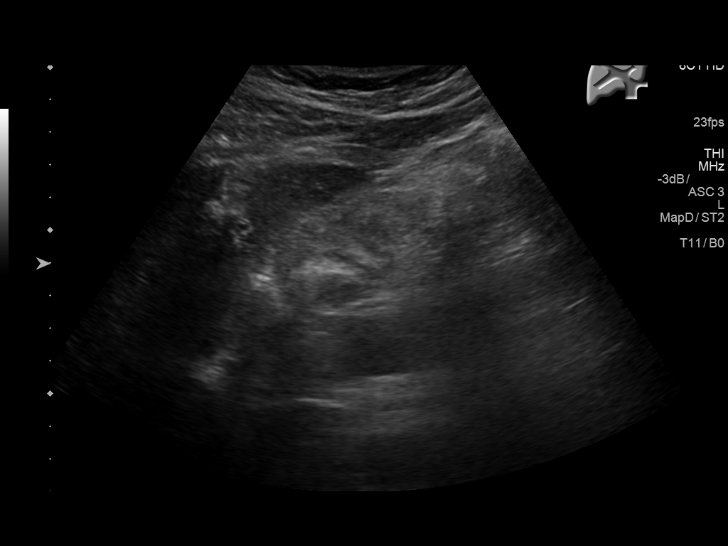
[im 26/39]
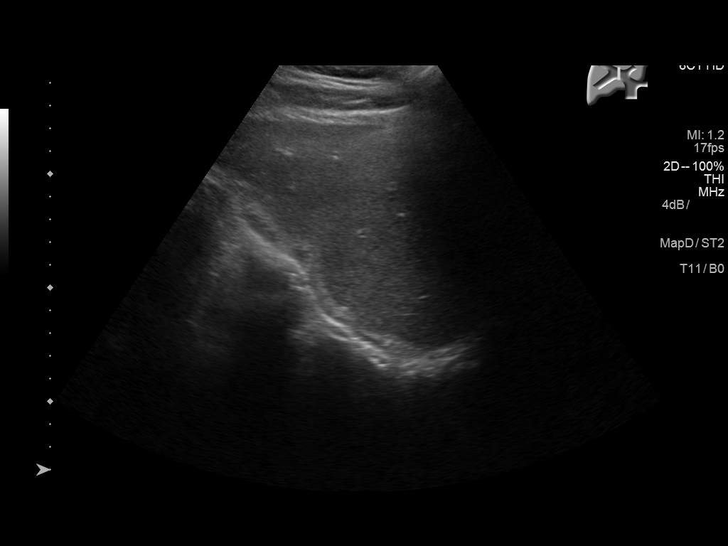
[im 29/39]
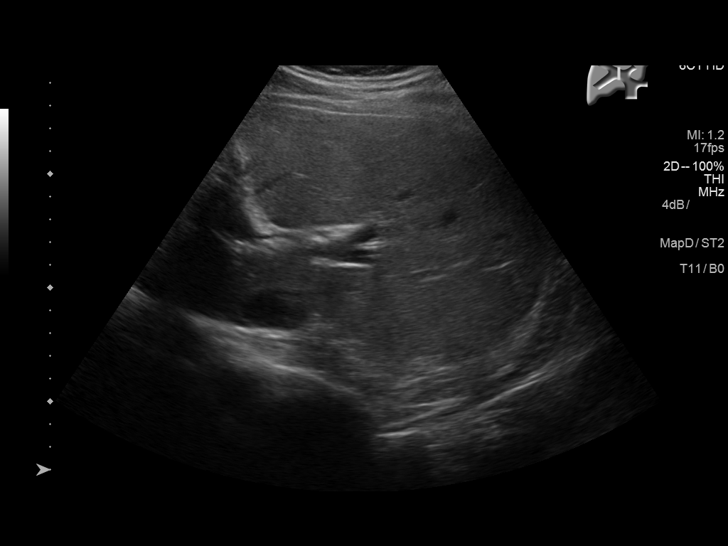
[im 32/39]
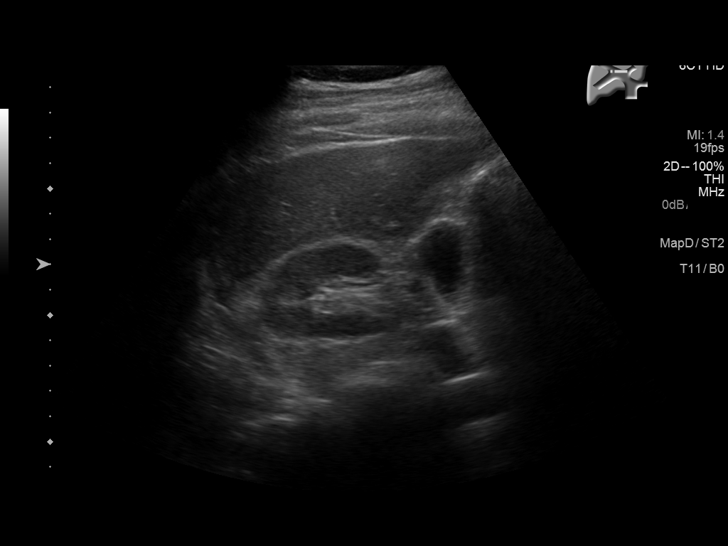
[im 35/39]
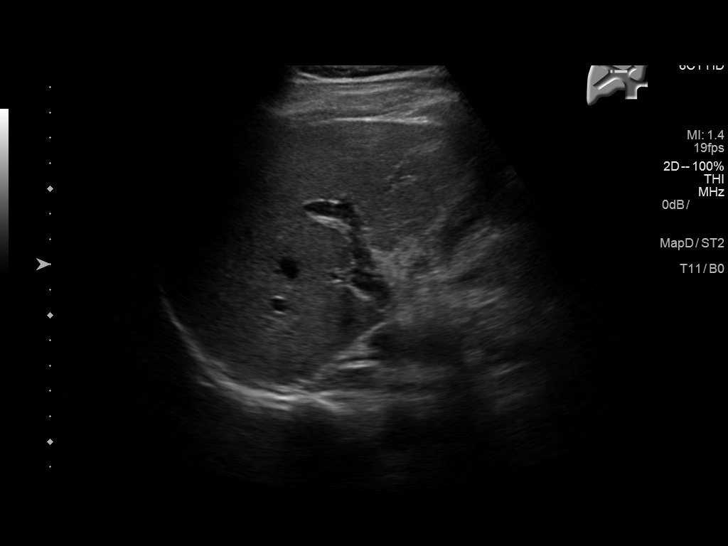
[im 39/39]
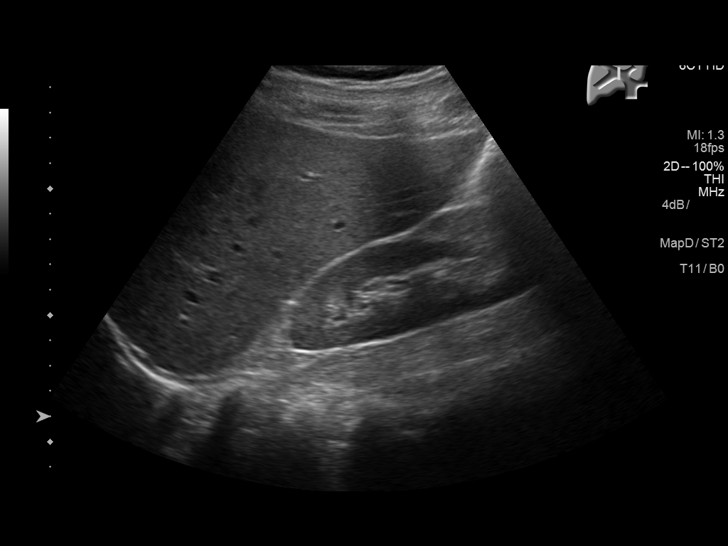

[14 of 25 positions shown; findings below may reference images not displayed]

FINDINGS: Gallbladder:

No gallstones or wall thickening visualized. No sonographic Murphy
sign noted by sonographer.

Common bile duct:

Diameter: 3.1 mm

Liver:

No focal lesion identified. Within normal limits in parenchymal
echogenicity.
IMPRESSION: Normal limited right upper quadrant ultrasound. If there are
clinical concerns of chronic gallbladder dysfunction, a nuclear
medicine hepatobiliary scan with gallbladder ejection fraction
determination may be useful.

## 2019-08-02 ENCOUNTER — Other Ambulatory Visit: Payer: Self-pay

## 2019-08-02 ENCOUNTER — Ambulatory Visit (INDEPENDENT_AMBULATORY_CARE_PROVIDER_SITE_OTHER): Payer: BC Managed Care – PPO | Admitting: Primary Care

## 2019-08-02 ENCOUNTER — Encounter: Payer: Self-pay | Admitting: Primary Care

## 2019-08-02 VITALS — BP 122/82 | HR 68 | Temp 97.5°F | Ht 69.0 in | Wt 180.5 lb

## 2019-08-02 DIAGNOSIS — K219 Gastro-esophageal reflux disease without esophagitis: Secondary | ICD-10-CM | POA: Diagnosis not present

## 2019-08-02 DIAGNOSIS — G8929 Other chronic pain: Secondary | ICD-10-CM

## 2019-08-02 DIAGNOSIS — R109 Unspecified abdominal pain: Secondary | ICD-10-CM

## 2019-08-02 MED ORDER — OMEPRAZOLE 20 MG PO CPDR
20.0000 mg | DELAYED_RELEASE_CAPSULE | Freq: Every day | ORAL | 0 refills | Status: DC
Start: 2019-08-02 — End: 2019-08-31

## 2019-08-02 NOTE — Patient Instructions (Signed)
Avoid heavy/fatty meals when possible.  Stop by the lab prior to leaving today. I will notify you of your results once received.   You will be contacted regarding your ultrasound.  Please let us know if you have not been contacted within a few days.  Start omeprazole 20 mg once daily for esophageal reflux.  Please update me in a few weeks as discussed.  It was a pleasure to see you today!

## 2019-08-02 NOTE — Assessment & Plan Note (Signed)
Daily symptoms. Rx for omeprazole sent to pharmacy. He will update in a few weeks.

## 2019-08-02 NOTE — Progress Notes (Signed)
Subjective:    Patient ID: Rodney Gardner, male    DOB: July 14, 1985, 34 y.o.   MRN: [id number]  HPI   This visit occurred during the SARS-CoV-2 public health emergency.  Safety protocols were in place, including screening questions prior to the visit, additional usage of staff PPE, and extensive cleaning of exam room while observing appropriate contact time as indicated for disinfecting solutions.   Mr. Seman is a 34 year old male with a history of IBS, GERD, GAD, microscopic hematuria, chronic abdominal pain who presents today with a chief complaint of abdominal pain.  His pain is located to the umbilical region, right upper and lower quadrants, bilateral trunk that is constant but waxes and wanes in intensity. He also endorses daily esophageal burning, throat fullness, gas/bloating feeling. He underwent cholecystectomy in 2018 and his abdominal pain improved overall, but he continues to have pain that is similar and different that before.   He mostly notices a pinching sensation around his umbilicus, his RUQ pain is dull, his entire abdomen feels "angry". He will wake in the morning and feel okay but as soon as he drinks coffee, eats breakfast his symptoms will start. Most of the time he has diarrhea after each meal which has been present since prior to his cholecystectomy. Sometimes has constipation.   He denies nausea, vomiting, bloody stools, unexplained weight loss. He was at [facility] eating burgers and fries two days ago and almost didn't make it to the bathroom in time for diarrhea that occurred within 30 minutes.   Diet currently consists of:  Breakfast: Fast food Lunch: Sandwich, chips Dinner: Protein, vegetable, starch Snacks: Occasionally, junk food Desserts: Infrequently ice cream Beverages: Coffee, water, canned soda.   Review of Systems  Constitutional: Negative for fever.  Gastrointestinal: Positive for abdominal distention and diarrhea. Negative for blood in stool, nausea and  vomiting.       Esophageal burning        Past Medical History:  Diagnosis Date  . Allergy    Seasonal allergies  . Anxiety   . GERD (gastroesophageal reflux disease)   . Irritable bowel syndrome (IBS)      Social History   Socioeconomic History  . Marital status: Single    Spouse name: Not on file  . Number of children: 1  . Years of education: Not on file  . Highest education level: Not on file  Occupational History  . Not on file  Tobacco Use  . Smoking status: Former Smoker    Packs/day: 0.50  . Smokeless tobacco: Never Used  Substance and Sexual Activity  . Alcohol use: Yes    Alcohol/week: 0.0 standard drinks    Comment: occ  . Drug use: No  . Sexual activity: Not on file  Other Topics Concern  . Not on file  Social History Narrative   Single.   Works on [employer] has been for 10 years.   73 son 71 years old.   Enjoys working out, being outside.   Has a place at the beach.   Social Determinants of Health   Financial Resource Strain:   . Difficulty of Paying Living Expenses: Not on file  Food Insecurity:   . Worried About Charity fundraiser in the Last Year: Not on file  . Ran Out of Food in the Last Year: Not on file  Transportation Needs:   . Lack of Transportation (Medical): Not on file  . Lack of Transportation (Non-Medical): Not on file  Physical  Activity:   . Days of Exercise per Week: Not on file  . Minutes of Exercise per Session: Not on file  Stress:   . Feeling of Stress : Not on file  Social Connections:   . Frequency of Communication with Friends and Family: Not on file  . Frequency of Social Gatherings with Friends and Family: Not on file  . Attends Religious Services: Not on file  . Active Member of Clubs or Organizations: Not on file  . Attends Archivist Meetings: Not on file  . Marital Status: Not on file  Intimate Partner Violence:   . Fear of Current or Ex-Partner: Not on file  . Emotionally Abused: Not on file  .  Physically Abused: Not on file  . Sexually Abused: Not on file    Past Surgical History:  Procedure Laterality Date  . none      Family History  Problem Relation Age of Onset  . Thyroid cancer Mother   . Anuerysm Paternal Grandfather        brain  . Prostate cancer Neg Hx   . Kidney cancer Neg Hx     Allergies  Allergen Reactions  . Wellbutrin [Bupropion] Hives    No current outpatient medications on file prior to visit.   Current Facility-Administered Medications on File Prior to Visit  Medication Dose Route Frequency Provider Last Rate Last Admin  . 0.9 %  sodium chloride infusion  500 mL Intravenous Continuous Danis, Estill Cotta III, MD        BP 122/82   Pulse 68   Temp (!) 97.5 F (36.4 C) (Temporal)   Ht 5\' 9"  (1.753 m)   Wt 180 lb 8 oz (81.9 kg)   SpO2 98%   BMI 26.66 kg/m    Objective:   Physical Exam  Constitutional: He appears well-nourished.  Cardiovascular: Normal rate and regular rhythm.  Respiratory: Effort normal and breath sounds normal.  GI: Soft. Bowel sounds are normal. There is abdominal tenderness in the right upper quadrant.    No periumbilical hernia palpated.   Musculoskeletal:     Cervical back: Neck supple.  Skin: Skin is warm and dry.  Psychiatric: He has a normal mood and affect.           Assessment & Plan:

## 2019-08-02 NOTE — Assessment & Plan Note (Addendum)
Chronic before and after cholecystectomy. Also with diarrhea, bloating, esophageal reflux.  Exam today with tenderness to RUQ on exam, otherwise negative. No obvious hernia palpated to umbilical region. He appears stable, is in no acute distress.  Symptoms more representative of IBS and GERD. Check labs today including Lipase, CBC, CMP. [place] of abdomen pending for evaluation of gall ducts, hernia, etc.  Treat reflux with omeprazole. Consider dicyclomine. He will update.

## 2019-08-03 LAB — COMPREHENSIVE METABOLIC PANEL
ALT: 17 U/L (ref 0–53)
AST: 16 U/L (ref 0–37)
Albumin: 4.5 g/dL (ref 3.5–5.2)
Alkaline Phosphatase: 74 U/L (ref 39–117)
BUN: 17 mg/dL (ref 6–23)
CO2: 29 mEq/L (ref 19–32)
Calcium: 9.6 mg/dL (ref 8.4–10.5)
Chloride: 101 mEq/L (ref 96–112)
Creatinine, Ser: 1.14 mg/dL (ref 0.40–1.50)
GFR: 73.32 mL/min (ref >60.00)
Glucose, Bld: 94 mg/dL (ref 70–99)
Potassium: 4.2 mEq/L (ref 3.5–5.1)
Sodium: 138 mEq/L (ref 135–145)
Total Bilirubin: 0.6 mg/dL (ref 0.2–1.2)
Total Protein: 7 g/dL (ref 6.0–8.3)

## 2019-08-03 LAB — CBC WITH DIFFERENTIAL/PLATELET
Basophils Absolute: 0.1 10*3/uL (ref 0.0–0.1)
Basophils Relative: 1 % (ref 0.0–3.0)
Eosinophils Absolute: 0.1 10*3/uL (ref 0.0–0.7)
Eosinophils Relative: 1.7 % (ref 0.0–5.0)
HCT: 44.3 % (ref 39.0–52.0)
Hemoglobin: 15.3 g/dL (ref 13.0–17.0)
Lymphocytes Relative: 25.6 % (ref 12.0–46.0)
Lymphs Abs: 2.1 10*3/uL (ref 0.7–4.0)
MCHC: 34.4 g/dL (ref 30.0–36.0)
MCV: 96.3 fl (ref 78.0–100.0)
Monocytes Absolute: 0.7 10*3/uL (ref 0.1–1.0)
Monocytes Relative: 8 % (ref 3.0–12.0)
Neutro Abs: 5.3 10*3/uL (ref 1.4–7.7)
Neutrophils Relative %: 63.7 % (ref 43.0–77.0)
Platelets: 285 10*3/uL (ref 150.0–400.0)
RBC: 4.6 Mil/uL (ref 4.22–5.81)
RDW: 12.5 % (ref 11.5–15.5)
WBC: 8.4 10*3/uL (ref 4.0–10.5)

## 2019-08-03 LAB — LIPASE: Lipase: 21 U/L (ref 11.0–59.0)

## 2019-08-09 ENCOUNTER — Other Ambulatory Visit: Payer: Self-pay | Admitting: Primary Care

## 2019-08-09 DIAGNOSIS — R109 Unspecified abdominal pain: Secondary | ICD-10-CM

## 2019-08-09 DIAGNOSIS — G8929 Other chronic pain: Secondary | ICD-10-CM

## 2019-08-10 ENCOUNTER — Ambulatory Visit
Admission: RE | Admit: 2019-08-10 | Discharge: 2019-08-10 | Disposition: A | Discharge status: Home / Self Care | Payer: BC Managed Care – PPO | Source: Ambulatory Visit | Attending: Primary Care | Admitting: Primary Care

## 2019-08-10 ENCOUNTER — Other Ambulatory Visit: Payer: Self-pay

## 2019-08-10 DIAGNOSIS — G8929 Other chronic pain: Secondary | ICD-10-CM

## 2019-08-10 DIAGNOSIS — R109 Unspecified abdominal pain: Secondary | ICD-10-CM | POA: Insufficient documentation

## 2019-08-25 ENCOUNTER — Other Ambulatory Visit: Payer: Self-pay | Admitting: Primary Care

## 2019-08-25 DIAGNOSIS — K219 Gastro-esophageal reflux disease without esophagitis: Secondary | ICD-10-CM

## 2019-08-25 NOTE — Telephone Encounter (Signed)
Last prescribed on 08/02/2019 . Last appointment on 08/02/2019. No future appointment

## 2019-08-26 NOTE — Telephone Encounter (Signed)
See my chart message

## 2019-08-27 DIAGNOSIS — K219 Gastro-esophageal reflux disease without esophagitis: Secondary | ICD-10-CM

## 2019-08-31 MED ORDER — OMEPRAZOLE 20 MG PO CPDR: 20.0000 mg | DELAYED_RELEASE_CAPSULE | Freq: Every day | ORAL | 0 refills | Status: DC

## 2019-09-09 ENCOUNTER — Other Ambulatory Visit: Payer: Self-pay

## 2019-09-09 ENCOUNTER — Ambulatory Visit (INDEPENDENT_AMBULATORY_CARE_PROVIDER_SITE_OTHER): Payer: BC Managed Care – PPO | Admitting: Primary Care

## 2019-09-09 DIAGNOSIS — R0683 Snoring: Secondary | ICD-10-CM | POA: Diagnosis not present

## 2019-09-09 NOTE — Patient Instructions (Signed)
You will be contacted regarding your referral to pulmonology for the sleep study.  Please let us know if you have not been contacted within two weeks.   It was a pleasure to see you today! Allie Bossier, NP-C

## 2019-09-09 NOTE — Progress Notes (Signed)
Subjective:    Patient ID: Rodney Gardner, male    DOB: 23-Jan-1986, 33 y.o.   MRN: [id number]  HPI  Virtual Visit via Video Note  I connected with Rodney Gardner on 09/09/19 at  7:20 AM EST by a video enabled telemedicine application and verified that I am speaking with the correct person using two identifiers.  Location: Patient: Work Provider: Office   I discussed the limitations of evaluation and management by telemedicine and the availability of in person appointments. The patient expressed understanding and agreed to proceed.  History of Present Illness:  Mr. Rodney Gardner is a 34 year old male with a history of GERD, GAD, chronic abdominal pain who presents today to discuss snoring.   Chronic for years. He has been told by numerous people over the years that he snores. His wife has noticed that he wake up during the night and gasp for air, also stops breathing during the night and his wife has to nudge him. If he has any alcohol intake his snoring is worse. He has daytime tiredness, feels like he could fall asleep if sitting still too long.  His wife is 6 months pregnant and isn't getting any sleep due to his snoring. He's never had a sleep study.   Observations/Objective:  Alert and oriented. Appears well, not sickly. No distress. Speaking in complete sentences.   Assessment and Plan:  Chronic, bothersome to family. Seems to have strong symptoms of sleep apnea. Referral placed to pulmonology for sleep study.  Follow Up Instructions:  You will be contacted regarding your referral to pulmonology for the sleep study.  Please let us know if you have not been contacted within two weeks.   It was a pleasure to see you today! Rodney Bossier, NP-C    I discussed the assessment and treatment plan with the patient. The patient was provided an opportunity to ask questions and all were answered. The patient agreed with the plan and demonstrated an understanding of the instructions.   The patient was advised to call back or seek an in-person evaluation if the symptoms worsen or if the condition fails to improve as anticipated.   Rodney Koch, NP    Review of Systems  Constitutional:       Daytime tiredness   Respiratory:       Snoring, see HPI  Cardiovascular: Negative for chest pain.  Psychiatric/Behavioral: Negative for sleep disturbance.       Past Medical History:  Diagnosis Date  . Allergy    Seasonal allergies  . Anxiety   . GERD (gastroesophageal reflux disease)   . Irritable bowel syndrome (IBS)      Social History   Socioeconomic History  . Marital status: Single    Spouse name: Not on file  . Number of children: 1  . Years of education: Not on file  . Highest education level: Not on file  Occupational History  . Not on file  Tobacco Use  . Smoking status: Former Smoker    Packs/day: 0.50  . Smokeless tobacco: Never Used  Substance and Sexual Activity  . Alcohol use: Yes    Alcohol/week: 0.0 standard drinks    Comment: occ  . Drug use: No  . Sexual activity: Not on file  Other Topics Concern  . Not on file  Social History Narrative   Single.   Works on [employer] has been for 10 years.   85 son 20 years old.   Enjoys working out, being outside.  Has a place at the beach.   Social Determinants of Health   Financial Resource Strain:   . Difficulty of Paying Living Expenses:   Food Insecurity:   . Worried About Charity fundraiser in the Last Year:   . Arboriculturist in the Last Year:   Transportation Needs:   . Film/video editor (Medical):   Marland Kitchen Lack of Transportation (Non-Medical):   Physical Activity:   . Days of Exercise per Week:   . Minutes of Exercise per Session:   Stress:   . Feeling of Stress :   Social Connections:   . Frequency of Communication with Friends and Family:   . Frequency of Social Gatherings with Friends and Family:   . Attends Religious Services:   . Active Member of Clubs or  Organizations:   . Attends Archivist Meetings:   Marland Kitchen Marital Status:   Intimate Partner Violence:   . Fear of Current or Ex-Partner:   . Emotionally Abused:   Marland Kitchen Physically Abused:   . Sexually Abused:     Past Surgical History:  Procedure Laterality Date  . none      Family History  Problem Relation Age of Onset  . Thyroid cancer Mother   . Anuerysm Paternal Grandfather        brain  . Prostate cancer Neg Hx   . Kidney cancer Neg Hx     Allergies  Allergen Reactions  . Wellbutrin [Bupropion] Hives    Current Outpatient Medications on File Prior to Visit  Medication Sig Dispense Refill  . omeprazole (PRILOSEC) 20 MG capsule Take 1 capsule (20 mg total) by mouth daily. For heartburn. 30 capsule 0   Current Facility-Administered Medications on File Prior to Visit  Medication Dose Route Frequency Provider Last Rate Last Admin  . 0.9 %  sodium chloride infusion  500 mL Intravenous Continuous Nelida Meuse III, MD        There were no vitals taken for this visit.   Objective:   Physical Exam  Constitutional: He is oriented to person, place, and time. He appears well-nourished.  Respiratory: Effort normal.  Neurological: He is alert and oriented to person, place, and time.  Psychiatric: He has a normal mood and affect.           Assessment & Plan:

## 2019-09-28 ENCOUNTER — Other Ambulatory Visit: Payer: Self-pay | Admitting: Primary Care

## 2019-09-28 DIAGNOSIS — K219 Gastro-esophageal reflux disease without esophagitis: Secondary | ICD-10-CM

## 2019-09-29 NOTE — Telephone Encounter (Signed)
See my chart message

## 2019-09-29 NOTE — Telephone Encounter (Signed)
Last prescribed on 3/2/20201 . Last appointment on 09/09/2019. No future appointment

## 2019-10-08 ENCOUNTER — Ambulatory Visit (INDEPENDENT_AMBULATORY_CARE_PROVIDER_SITE_OTHER): Payer: BC Managed Care – PPO | Admitting: Pulmonary Disease

## 2019-10-08 ENCOUNTER — Encounter: Payer: Self-pay | Admitting: Pulmonary Disease

## 2019-10-08 ENCOUNTER — Other Ambulatory Visit: Payer: Self-pay

## 2019-10-08 VITALS — BP 110/78 | HR 84 | Temp 98.7°F | Ht 69.0 in | Wt 196.0 lb

## 2019-10-08 DIAGNOSIS — G4733 Obstructive sleep apnea (adult) (pediatric): Secondary | ICD-10-CM | POA: Insufficient documentation

## 2019-10-08 DIAGNOSIS — Z9189 Other specified personal risk factors, not elsewhere classified: Secondary | ICD-10-CM | POA: Diagnosis not present

## 2019-10-08 NOTE — Progress Notes (Signed)
Reviewed and agree with assessment/plan.   Hillery Zachman, MD [facility] 06/26/2016, 12:24 PM Pager:  [phone]  

## 2019-10-08 NOTE — Assessment & Plan Note (Addendum)
Elevated Epworth score of 19 today Sleep ROS performed today High likelihood of obstructive sleep apnea Mallampati 1 BMI 28.9 Discussed pathophysiology of obstructive sleep apnea Discussed treatment options such as CPAP, oral appliance, weight loss  Plan: We will order home sleep study to further evaluate

## 2019-10-08 NOTE — Patient Instructions (Addendum)
You were seen today by Lauraine Rinne, NP  for:   1. At risk for obstructive sleep apnea  - Home sleep test; Future  I believe there is a high likelihood that you have obstructive sleep apnea.  We will order a home sleep study to further evaluate this.  We will see you back in office or talk with you over the phone after we have those results.  Please call our office with any questions or concerns.  We recommend today:  Orders Placed This Encounter  Procedures  . Home sleep test    Standing Status:   Future    Standing Expiration Date:   10/07/2020    Order Specific Question:   Where should this test be performed:    Answer:   [facility]   Orders Placed This Encounter  Procedures  . Home sleep test   No orders of the defined types were placed in this encounter.   Follow Up:    Return in about 3 months (around 01/07/2020), or if symptoms worsen or fail to improve, for Follow up with Wyn Quaker FNP-C, Follow up with Dr. Halford Chessman.   Please do your part to reduce the spread of COVID-19:      Reduce your risk of any infection  and COVID19 by using the similar precautions used for avoiding the common cold or flu:  Marland Kitchen Wash your hands often with soap and warm water for at least 20 seconds.  If soap and water are not readily available, use an alcohol-based hand sanitizer with at least 60% alcohol.  . If coughing or sneezing, cover your mouth and nose by coughing or sneezing into the elbow areas of your shirt or coat, into a tissue or into your sleeve (not your hands). Langley Gauss A MASK when in public  . Avoid shaking hands with others and consider head nods or verbal greetings only. . Avoid touching your eyes, nose, or mouth with unwashed hands.  . Avoid close contact with people who are sick. . Avoid places or events with large numbers of people in one location, like concerts or sporting events. . If you have some symptoms but not all symptoms, continue to monitor at home and seek medical  attention if your symptoms worsen. . If you are having a medical emergency, call 911.   [place] / e-Visit: [email or web address]         [facility]: [facility]: [id number]                   [facility]: [id number]     It is flu season:   >>> Best ways to protect herself from the flu: Receive the yearly flu vaccine, practice good hand hygiene washing with soap and also using hand sanitizer when available, eat a nutritious meals, get adequate rest, hydrate appropriately   Please contact the office if your symptoms worsen or you have concerns that you are not improving.   Thank you for choosing [facility] for your healthcare, and for allowing [place] to partner with you on your healthcare journey. I am thankful to be able to provide care to you today.   Wyn Quaker FNP-C

## 2019-10-08 NOTE — Progress Notes (Signed)
@Patient  ID: Rodney Gardner, male    DOB: 01-18-1986, 34 y.o.   MRN: [id number]  Chief Complaint  Patient presents with  . sleep consult    Referring provider: [name], NP  HPI:  34 year old male former smoker referred to our office on 10/08/2019 for sleep consult evaluation  PMH: GERD, anxiety, IBS Smoker/ Smoking History: Former smoker.  Quit June/2020.  20-pack-year smoking history. Maintenance:  none Pt of: Future patient of Dr. Halford Chessman  10/08/2019  - Visit   34 year old male former smoker presenting to our office on 10/08/2019 for sleep consult evaluation.  Patient reporting that his spouse feels that he snores.  Epworth score today is 19.  Patient has never had a home sleep study before.  See sleep ROS listed below:  SLEEP ROS   Epworth score today: 19  Do you feel you have non-refreshing sleep?: yes Daytime sleepiness?: yes Witnessed apneas?: yes Loud snoring?: yes Choking or gasping episodes that wake pt up from sleep?: yes Does patient experienced sleepiness passenger in a car, lying down to rest in the afternoons, sitting and reading, watching TV or other social situations?: yes  Bedtime is typically around: 9pm  Sleep latency?: 5-60min  Which position does the patient sleep in: sides Nocturnal awakenings?: yes 1-2x Nightmares?: no  Sleep talking?: yes Restless Legs?: no  When this patient out of bed in the morning?: 530am  When wake up to you feel tired, treated any dryness in the mouth, morning headaches?: no    Questionaires / Pulmonary Flowsheets:   Epworth:  Results of the Epworth flowsheet 10/08/2019  Sitting and reading 3  Watching TV 1  Sitting, inactive in a public place (e.g. a theatre or a meeting) 3  As a passenger in a car for an hour without a break 3  Lying down to rest in the afternoon when circumstances permit 3  Sitting and talking to someone 2  Sitting quietly after a lunch without alcohol 3  In a car, while stopped for a few  minutes in traffic 1  Total score 19    Tests:   FENO:  No results found for: NITRICOXIDE  PFT: No flowsheet data found.  WALK:  No flowsheet data found.  Imaging: No results found.  Lab Results:  CBC    Component Value Date/Time   WBC 8.4 08/02/2019 1605   RBC 4.60 08/02/2019 1605   HGB 15.3 08/02/2019 1605   HGB 15.9 10/13/2014 1110   HCT 44.3 08/02/2019 1605   HCT 46.3 10/13/2014 1110   PLT 285.0 08/02/2019 1605   PLT 266 10/13/2014 1110   MCV 96.3 08/02/2019 1605   MCV 96 10/13/2014 1110   MCH 33.9 12/11/2016 0909   MCHC 34.4 08/02/2019 1605   RDW 12.5 08/02/2019 1605   RDW 12.2 10/13/2014 1110   LYMPHSABS 2.1 08/02/2019 1605   LYMPHSABS 1.3 11/01/2013 1520   MONOABS 0.7 08/02/2019 1605   MONOABS 0.7 11/01/2013 1520   EOSABS 0.1 08/02/2019 1605   EOSABS 0.0 11/01/2013 1520   BASOSABS 0.1 08/02/2019 1605   BASOSABS 0.1 11/01/2013 1520    BMET    Component Value Date/Time   NA 138 08/02/2019 1605   NA 137 10/13/2014 1110   K 4.2 08/02/2019 1605   K 3.9 10/13/2014 1110   CL 101 08/02/2019 1605   CL 103 10/13/2014 1110   CO2 29 08/02/2019 1605   CO2 29 10/13/2014 1110   GLUCOSE 94 08/02/2019 1605   GLUCOSE 99 10/13/2014  1110   BUN 17 08/02/2019 1605   BUN 16 10/13/2014 1110   CREATININE 1.14 08/02/2019 1605   CREATININE 1.08 10/13/2014 1110   CALCIUM 9.6 08/02/2019 1605   CALCIUM 9.3 10/13/2014 1110   GFRNONAA >60 12/11/2016 0909   GFRNONAA >60 10/13/2014 1110   GFRAA >60 12/11/2016 0909   GFRAA >60 10/13/2014 1110    BNP No results found for: BNP  ProBNP No results found for: PROBNP  Specialty Problems    None      Allergies  Allergen Reactions  . Wellbutrin [Bupropion] Hives    Immunization History  Administered Date(s) Administered  . Influenza Whole 07/06/2018  . Influenza-Unspecified 08/27/2017    Past Medical History:  Diagnosis Date  . Allergy    Seasonal allergies  . Anxiety   . GERD (gastroesophageal reflux  disease)   . Irritable bowel syndrome (IBS)     Tobacco History: Social History   Tobacco Use  Smoking Status Former Smoker  . Packs/day: 1.00  . Years: 20.00  . Pack years: 20.00  . Types: Cigarettes  . Start date: 2000  . Quit date: 12/07/2018  . Years since quitting: 0.8  Smokeless Tobacco Never Used  Tobacco Comment   tried vaping but not enough to count not even a month   Counseling given: Yes Comment: tried vaping but not enough to count not even a month   Continue to not smoke  Outpatient Encounter Medications as of 10/08/2019  Medication Sig  . omeprazole (PRILOSEC) 20 MG capsule Take 1 capsule (20 mg total) by mouth daily. For heartburn. (Patient not taking: Reported on 10/08/2019)   Facility-Administered Encounter Medications as of 10/08/2019  Medication  . 0.9 %  sodium chloride infusion     Review of Systems  Review of Systems  Constitutional: Negative for activity change, chills, fatigue, fever and unexpected weight change.  HENT: Negative for postnasal drip, rhinorrhea, sinus pressure, sinus pain and sore throat.   Eyes: Negative.   Respiratory: Negative for cough, shortness of breath and wheezing.   Cardiovascular: Negative for chest pain and palpitations.  Gastrointestinal: Negative for constipation, diarrhea, nausea and vomiting.  Endocrine: Negative.   Genitourinary: Negative.   Musculoskeletal: Negative.   Skin: Negative.   Neurological: Negative for dizziness and headaches.  Psychiatric/Behavioral: Positive for sleep disturbance. Negative for dysphoric mood. The patient is not nervous/anxious.   All other systems reviewed and are negative.    Physical Exam  BP 110/78 (BP Location: Left Arm, Cuff Size: Normal)   Pulse 84   Temp 98.7 F (37.1 C) (Temporal)   Ht 5\' 9"  (1.753 m)   Wt 196 lb (88.9 kg)   SpO2 96%   BMI 28.94 kg/m   Wt Readings from Last 5 Encounters:  10/08/19 196 lb (88.9 kg)  08/02/19 180 lb 8 oz (81.9 kg)  01/28/17 171 lb  8 oz (77.8 kg)  01/14/17 175 lb (79.4 kg)  12/12/16 173 lb (78.5 kg)    BMI Readings from Last 5 Encounters:  10/08/19 28.94 kg/m  08/02/19 26.66 kg/m  01/28/17 25.33 kg/m  01/14/17 25.84 kg/m  12/12/16 26.30 kg/m     Physical Exam Vitals and nursing note reviewed.  Constitutional:      General: He is not in acute distress.    Appearance: Normal appearance. He is normal weight.  HENT:     Head: Normocephalic and atraumatic.     Right Ear: Hearing and external ear normal.     Left Ear: Hearing  and external ear normal.     Nose: Nose normal. No mucosal edema or rhinorrhea.     Right Turbinates: Not enlarged.     Left Turbinates: Not enlarged.     Mouth/Throat:     Mouth: Mucous membranes are dry.     Pharynx: Oropharynx is clear. No oropharyngeal exudate.     Comments: Mallampati 1 Eyes:     Pupils: Pupils are equal, round, and reactive to light.  Cardiovascular:     Rate and Rhythm: Normal rate and regular rhythm.     Pulses: Normal pulses.     Heart sounds: Normal heart sounds. No murmur.  Pulmonary:     Effort: Pulmonary effort is normal.     Breath sounds: Normal breath sounds. No decreased breath sounds, wheezing or rales.  Musculoskeletal:     Cervical back: Normal range of motion.     Right lower leg: No edema.     Left lower leg: No edema.  Lymphadenopathy:     Cervical: No cervical adenopathy.  Skin:    General: Skin is warm and dry.     Capillary Refill: Capillary refill takes less than 2 seconds.     Findings: No erythema or rash.  Neurological:     General: No focal deficit present.     Mental Status: He is alert and oriented to person, place, and time.     Motor: No weakness.     Coordination: Coordination normal.     Gait: Gait is intact. Gait normal.  Psychiatric:        Mood and Affect: Mood normal.        Behavior: Behavior normal. Behavior is cooperative.        Thought Content: Thought content normal.        Judgment: Judgment normal.        Assessment & Plan:   At risk for obstructive sleep apnea Elevated Epworth score of 19 today Sleep ROS performed today High likelihood of obstructive sleep apnea Mallampati 1 BMI 28.9 Discussed pathophysiology of obstructive sleep apnea Discussed treatment options such as CPAP, oral appliance, weight loss  Plan: We will order home sleep study to further evaluate    Return in about 3 months (around 01/07/2020), or if symptoms worsen or fail to improve, for Follow up with Wyn Quaker FNP-C, Follow up with Dr. Halford Chessman.   Lauraine Rinne, NP 10/08/2019   This appointment required 25 minutes of patient care (this includes precharting, chart review, review of results, face-to-face care, etc.).

## 2019-10-27 DIAGNOSIS — K219 Gastro-esophageal reflux disease without esophagitis: Secondary | ICD-10-CM

## 2019-10-27 MED ORDER — OMEPRAZOLE 20 MG PO CPDR: 20.0000 mg | DELAYED_RELEASE_CAPSULE | Freq: Every day | ORAL | 0 refills | Status: DC

## 2019-11-12 NOTE — Telephone Encounter (Signed)
PCC's can we see where he is on the list?

## 2019-11-16 NOTE — Telephone Encounter (Signed)
I called the patient today to try and schedule him picking up the HST machine.  I ask if he wanted to come to [facility] or [place]. He state [place] office would be better he works in [place] and his wife is 8 months pregnant.  I sent a message to Suanne Marker asking her to contact the patient to get him scheduled

## 2019-11-24 ENCOUNTER — Ambulatory Visit: Payer: BC Managed Care – PPO

## 2019-11-24 ENCOUNTER — Other Ambulatory Visit: Payer: Self-pay

## 2019-11-24 DIAGNOSIS — Z9189 Other specified personal risk factors, not elsewhere classified: Secondary | ICD-10-CM

## 2019-11-24 DIAGNOSIS — G4733 Obstructive sleep apnea (adult) (pediatric): Secondary | ICD-10-CM | POA: Diagnosis not present

## 2019-12-03 ENCOUNTER — Telehealth: Payer: Self-pay | Admitting: Pulmonary Disease

## 2019-12-03 DIAGNOSIS — G4733 Obstructive sleep apnea (adult) (pediatric): Secondary | ICD-10-CM

## 2019-12-03 NOTE — Telephone Encounter (Signed)
12/03/2019  Home sleep study from 11/24/19 showed moderate OSA with AHI 15.3, SpO2 low 92%.    Please contact the patient and let him know that sleep study showed moderate obstructive sleep apnea.  Would recommend getting the patient set up with Dr. Halford Chessman myself for either virtual or in person visit sometime over the next few weeks to discuss results as well as discuss treatment options.  If patient would like to proceed forward with starting CPAP therapy  Okay to place order:  CPAP start Mask of choice Supplies DME: Patient choice APAP setting 5-15 Patient will need a 59-month follow-up with Dr. Halford Chessman or Wyn Quaker, [place], FNP

## 2019-12-03 NOTE — Telephone Encounter (Signed)
Called and spoke with patient and he would like to proceed with CPAP. Order has been placed and he has also been set up for a TELEVISIT on 6/21 with Aaron Edelman. Nothing further needed at this time

## 2019-12-20 ENCOUNTER — Other Ambulatory Visit: Payer: Self-pay

## 2019-12-20 ENCOUNTER — Encounter: Payer: Self-pay | Admitting: Pulmonary Disease

## 2019-12-20 ENCOUNTER — Ambulatory Visit (INDEPENDENT_AMBULATORY_CARE_PROVIDER_SITE_OTHER): Payer: BC Managed Care – PPO | Admitting: Pulmonary Disease

## 2019-12-20 DIAGNOSIS — G4733 Obstructive sleep apnea (adult) (pediatric): Secondary | ICD-10-CM

## 2019-12-20 NOTE — Progress Notes (Signed)
Virtual Visit via Telephone Note  I connected with [name] on 12/20/19 at 10:00 AM EDT by telephone and verified that I am speaking with the correct person using two identifiers.  Location: Patient: Home Provider: Office [facility] - [address], [place], [place], [place] [address]   I discussed the limitations, risks, security and privacy concerns of performing an evaluation and management service by telephone and the availability of in person appointments. I also discussed with the patient that there may be a patient responsible charge related to this service. The patient expressed understanding and agreed to proceed.  Patient consented to consult via telephone: Yes People present and their role in pt care: Pt     History of Present Illness:  34 year old male former smoker referred to our office on 10/08/2019 for sleep consult evaluation  PMH: GERD, anxiety, IBS Smoker/ Smoking History: Former smoker.  Quit June/2020.  20-pack-year smoking history. Maintenance:  none Pt of: Dr. Halford Chessman  Chief complaint: Follow-up for CPAP   Patient reached for a telephone call today.  Patient reports that he has not been set up with CPAP yet.  We will further investigate this today.  Observations/Objective:  Home sleep study from 11/24/19 showed moderate OSA with AHI 15.3, SpO2 low 92%.   Social History   Tobacco Use  Smoking Status Former Smoker  . Packs/day: 1.00  . Years: 20.00  . Pack years: 20.00  . Types: Cigarettes  . Start date: 2000  . Quit date: 12/07/2018  . Years since quitting: 1.0  Smokeless Tobacco Never Used  Tobacco Comment   tried vaping but not enough to count not even a month   Immunization History  Administered Date(s) Administered  . Influenza Whole 07/06/2018  . Influenza-Unspecified 08/27/2017      Assessment and Plan:  OSA (obstructive sleep apnea) Plan: Contacted adapt Representative Melissa to notify that patient still has not been  set up for CPAP Informed patient to contact her office if patient is not contacted by adapt within the next 48 hours Patient to start CPAP therapy Patient needs to have follow-up with our office in 2 months to confirm 30-day compliance to CPAP therapy   Follow Up Instructions:  Return in about 2 months (around 02/19/2020), or if symptoms worsen or fail to improve, for Follow up with Wyn Quaker FNP-C.   I discussed the assessment and treatment plan with the patient. The patient was provided an opportunity to ask questions and all were answered. The patient agreed with the plan and demonstrated an understanding of the instructions.   The patient was advised to call back or seek an in-person evaluation if the symptoms worsen or if the condition fails to improve as anticipated.  I provided 8 minutes of non-face-to-face time during this encounter.   Lauraine Rinne, NP

## 2019-12-20 NOTE — Assessment & Plan Note (Signed)
Plan: Contacted adapt Representative Melissa to notify that patient still has not been set up for CPAP Informed patient to contact her office if patient is not contacted by adapt within the next 48 hours Patient to start CPAP therapy Patient needs to have follow-up with our office in 2 months to confirm 30-day compliance to CPAP therapy

## 2019-12-20 NOTE — Patient Instructions (Signed)
You were seen today by Lauraine Rinne, NP  for:   I have contacted [employer].  They should be contacting you regarding getting you set up.  If you have not been contacted by them within the next 24 to 48 hours please contact our office.  Aaron Edelman  1. OSA (obstructive sleep apnea)  We recommend that you start using your CPAP daily >>>Keep up the hard work using your device >>> Goal should be wearing this for the entire night that you are sleeping, at least 4 to 6 hours  Remember:  . Do not drive or operate heavy machinery if tired or drowsy.  . Please notify the supply company and office if you are unable to use your device regularly due to missing supplies or machine being broken.  . Work on maintaining a healthy weight and following your recommended nutrition plan  . Maintain proper daily exercise and movement  . Maintaining proper use of your device can also help improve management of other chronic illnesses such as: Blood pressure, blood sugars, and weight management.   BiPAP/ CPAP Cleaning:  >>>Clean weekly, with Dawn soap, and bottle brush.  Set up to air dry. >>> Wipe mask out daily with wet wipe or towelette   Follow Up:    Return in about 2 months (around 02/19/2020), or if symptoms worsen or fail to improve, for Follow up with Wyn Quaker FNP-C.   Please do your part to reduce the spread of COVID-19:      Reduce your risk of any infection  and COVID19 by using the similar precautions used for avoiding the common cold or flu:  Marland Kitchen Wash your hands often with soap and warm water for at least 20 seconds.  If soap and water are not readily available, use an alcohol-based hand sanitizer with at least 60% alcohol.  . If coughing or sneezing, cover your mouth and nose by coughing or sneezing into the elbow areas of your shirt or coat, into a tissue or into your sleeve (not your hands). Langley Gauss A MASK when in public  . Avoid shaking hands with others and consider head nods or verbal  greetings only. . Avoid touching your eyes, nose, or mouth with unwashed hands.  . Avoid close contact with people who are sick. . Avoid places or events with large numbers of people in one location, like concerts or sporting events. . If you have some symptoms but not all symptoms, continue to monitor at home and seek medical attention if your symptoms worsen. . If you are having a medical emergency, call 911.   [place] / e-Visit: [email or web address]         [facility]: [facility]: [phone]                   [facility]: [phone]     It is flu season:   >>> Best ways to protect herself from the flu: Receive the yearly flu vaccine, practice good hand hygiene washing with soap and also using hand sanitizer when available, eat a nutritious meals, get adequate rest, hydrate appropriately   Please contact the office if your symptoms worsen or you have concerns that you are not improving.   Thank you for choosing [facility] for your healthcare, and for allowing [place] to partner with you on your healthcare journey. I am thankful to be able to provide  care to you today.   Wyn Quaker FNP-C

## 2020-01-06 ENCOUNTER — Encounter: Payer: Self-pay | Admitting: *Deleted

## 2020-02-23 ENCOUNTER — Other Ambulatory Visit: Payer: Self-pay | Admitting: Primary Care

## 2020-02-23 DIAGNOSIS — K219 Gastro-esophageal reflux disease without esophagitis: Secondary | ICD-10-CM

## 2020-02-23 NOTE — Telephone Encounter (Signed)
Last prescribed on 10/27/2019 Last OV with Allie Bossier on 09/09/2019 No future OV scheduled

## 2020-02-24 NOTE — Telephone Encounter (Signed)
Requested Prescriptions   Signed Prescriptions Disp Refills   omeprazole (PRILOSEC) 20 MG capsule 90 capsule 0    Sig: TAKE 1 CAPSULE (20 MG TOTAL) BY MOUTH DAILY. FOR HEARTBURN.    Authorizing Provider: [name]   Refill(s) sent to pharmacy.

## 2021-03-26 IMAGING — US US ABDOMEN COMPLETE
1 series · 14 of 25 positions shown · non-contrast
Comparison: Limited right upper quadrant ultrasound on 12/09/2016

CLINICAL DATA: Right-sided abdominal pain for 1 year. Prior
cholecystectomy.

EXAM:
ABDOMEN ULTRASOUND COMPLETE

[Series 1: us abdomen complete · 0.22mm/px · 14 of 82 slices shown]
[im 1/82]
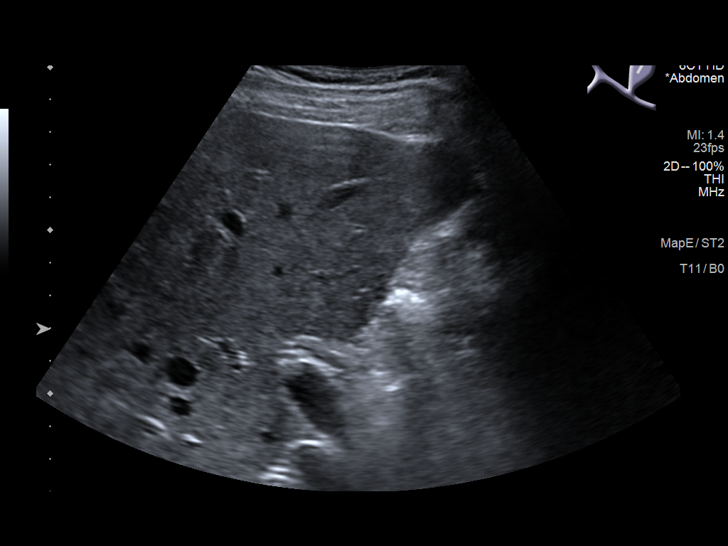
[im 7/82]
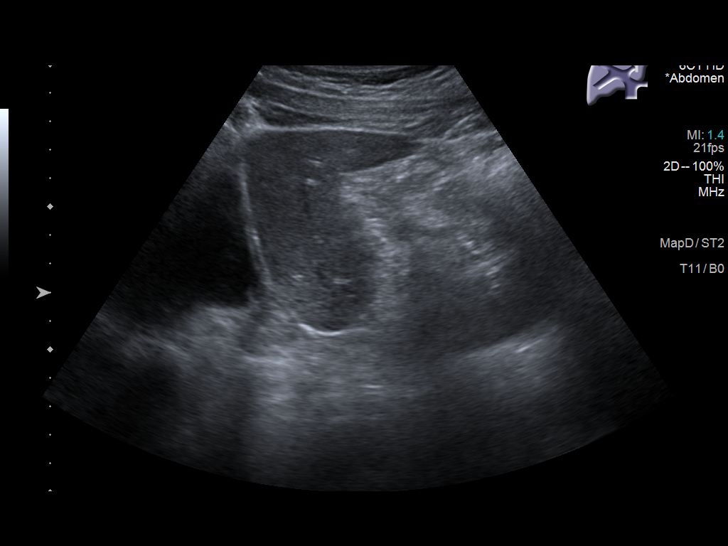
[im 14/82]
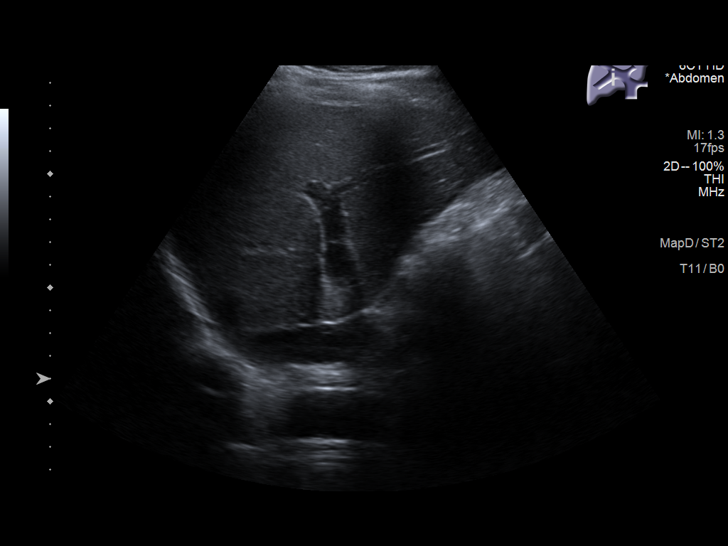
[im 21/82]
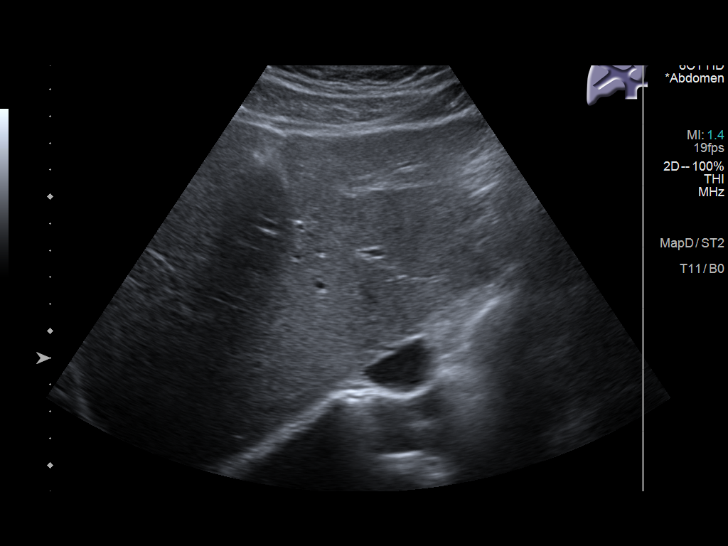
[im 28/82]
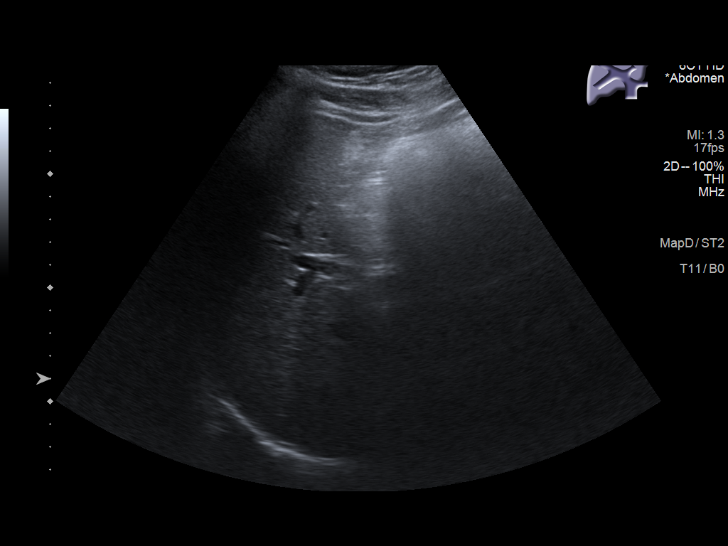
[im 31/82]
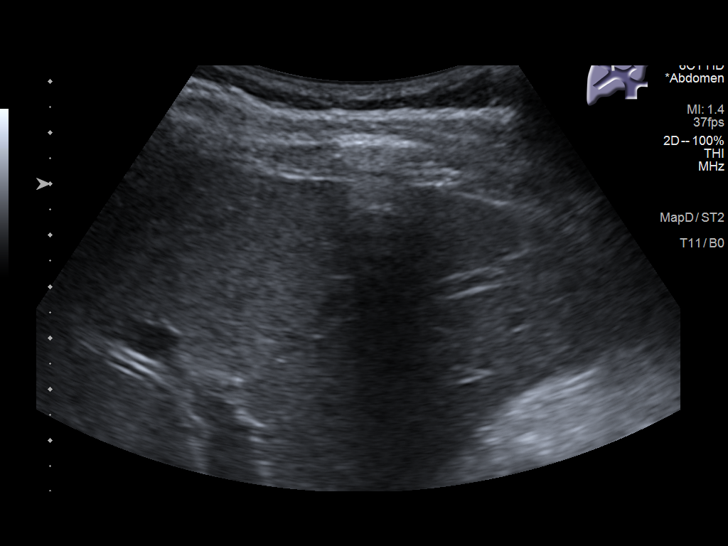
[im 38/82]
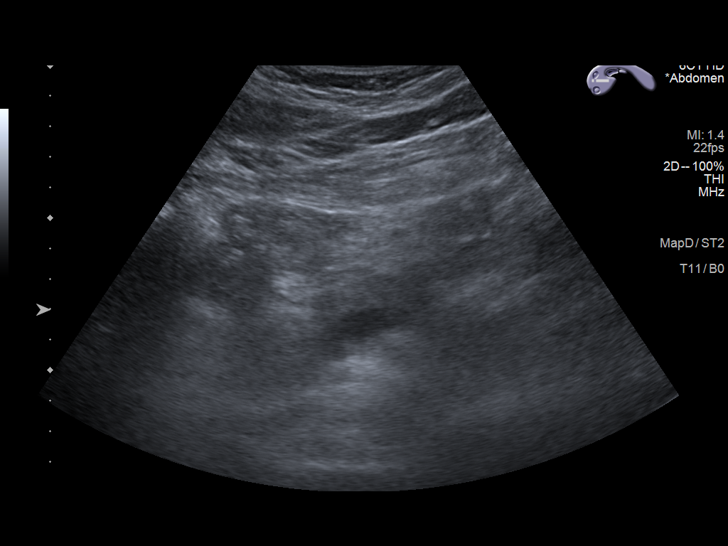
[im 44/82]
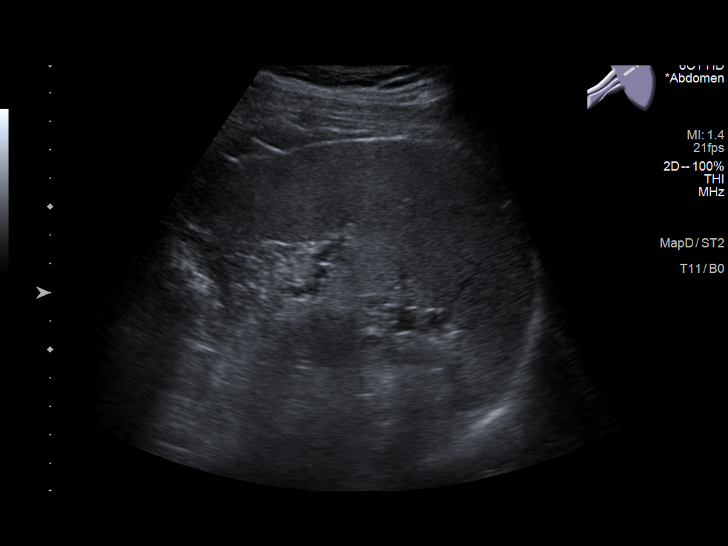
[im 51/82]
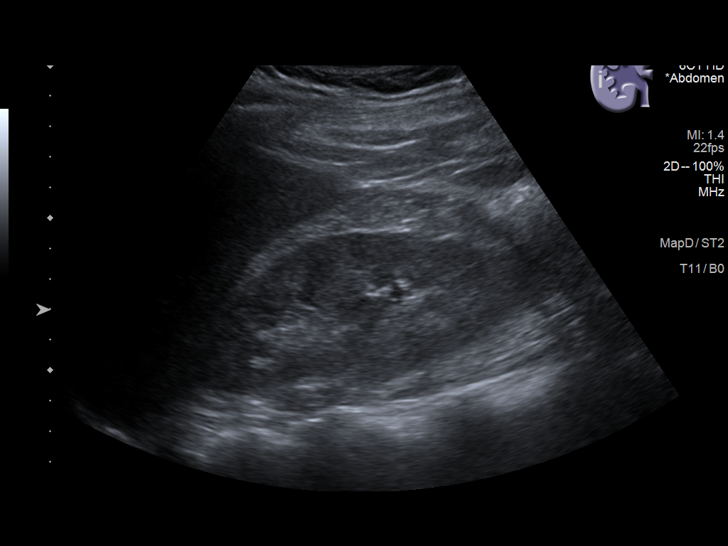
[im 55/82]
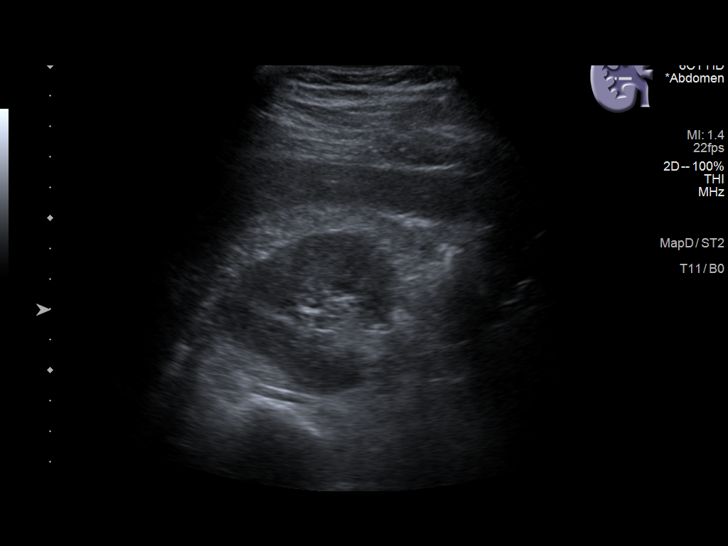
[im 61/82]
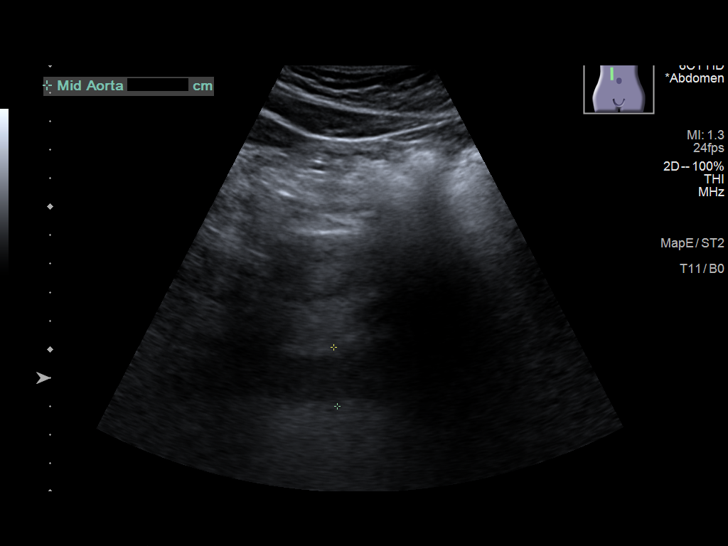
[im 68/82]
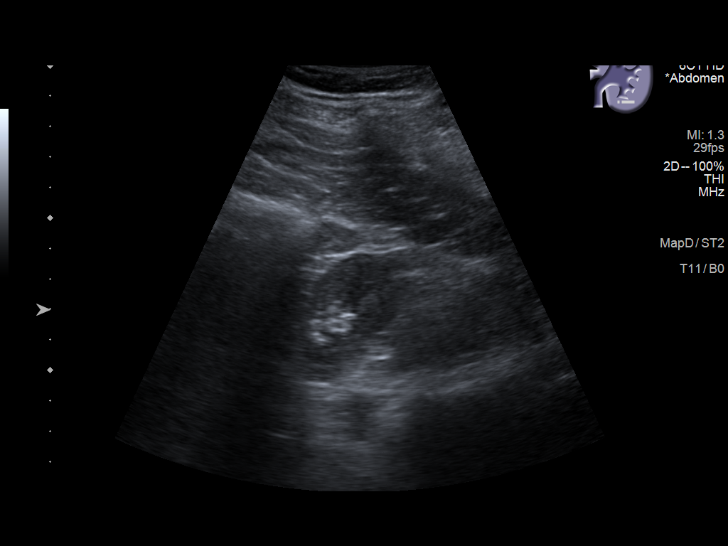
[im 75/82]
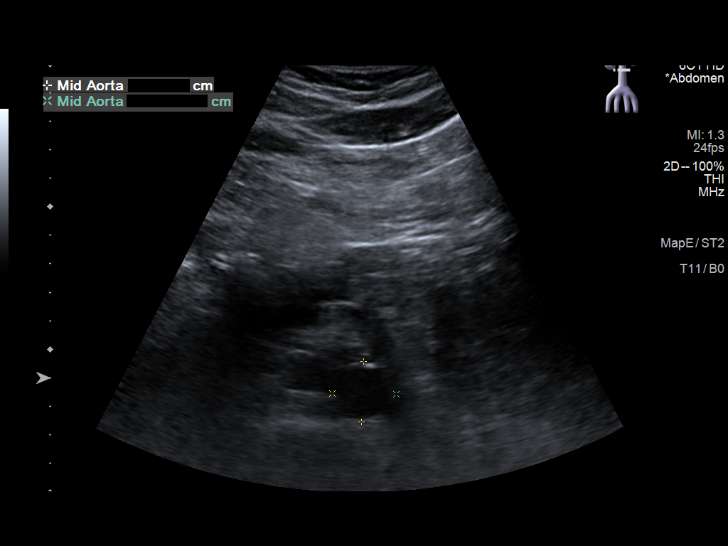
[im 82/82]
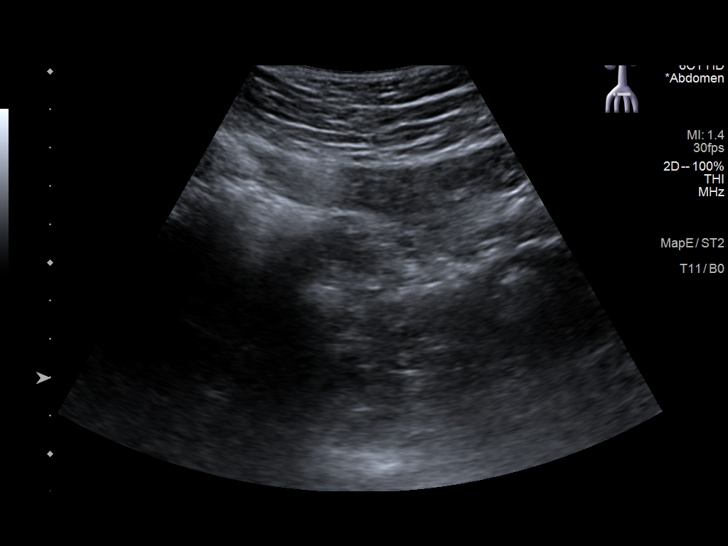

[14 of 25 positions shown; findings below may reference images not displayed]

FINDINGS: Gallbladder: Surgically absent.

Common bile duct: Diameter: 3 mm, within normal limits.

Liver: No focal lesion identified. Within normal limits in
parenchymal echogenicity. Portal vein is patent on color Doppler
imaging with normal direction of blood flow towards the liver.

IVC: No abnormality visualized.

Pancreas: Not visualized due to overlying bowel gas.

Spleen: Size and appearance within normal limits.

Right Kidney: Length: 11.2 cm. Echogenicity within normal limits. No
mass or hydronephrosis visualized.

Left Kidney: Length: 10.2 cm. Echogenicity within normal limits. No
mass or hydronephrosis visualized.

Abdominal aorta: No aneurysm visualized.

Other findings: None.
IMPRESSION: Prior cholecystectomy. No evidence of biliary ductal dilatation or
other significant abnormality.

## 2021-09-17 ENCOUNTER — Encounter: Payer: Self-pay | Admitting: Gastroenterology

## 2023-07-16 ENCOUNTER — Other Ambulatory Visit: Payer: Self-pay | Admitting: Emergency Medicine

## 2023-07-16 DIAGNOSIS — M5126 Other intervertebral disc displacement, lumbar region: Secondary | ICD-10-CM

## 2023-08-28 ENCOUNTER — Encounter: Payer: Self-pay | Admitting: Gastroenterology
